# Patient Record
Sex: Male | Born: 1960 | Race: Black or African American | Hispanic: No | Marital: Married | Smoking: Former smoker
Health system: Southern US, Community
[De-identification: ages and names within clinical notes are randomized; demographics above are authoritative.]

## PROBLEM LIST (undated history)

## (undated) DIAGNOSIS — E119 Type 2 diabetes mellitus without complications: Secondary | ICD-10-CM

## (undated) DIAGNOSIS — I10 Essential (primary) hypertension: Secondary | ICD-10-CM

## (undated) DIAGNOSIS — E785 Hyperlipidemia, unspecified: Secondary | ICD-10-CM

## (undated) DIAGNOSIS — I639 Cerebral infarction, unspecified: Secondary | ICD-10-CM

## (undated) HISTORY — PX: CHOLECYSTECTOMY: SHX55

---

## 2010-07-21 ENCOUNTER — Emergency Department: Payer: Self-pay | Admitting: Emergency Medicine

## 2010-07-29 ENCOUNTER — Ambulatory Visit: Payer: Self-pay | Admitting: Family Medicine

## 2014-06-11 ENCOUNTER — Emergency Department: Payer: Self-pay | Admitting: Emergency Medicine

## 2014-10-08 ENCOUNTER — Emergency Department: Payer: TRICARE For Life (TFL)

## 2014-10-08 ENCOUNTER — Other Ambulatory Visit: Payer: Self-pay

## 2014-10-08 ENCOUNTER — Encounter: Payer: Self-pay | Admitting: Emergency Medicine

## 2014-10-08 ENCOUNTER — Emergency Department
Admission: EM | Admit: 2014-10-08 | Discharge: 2014-10-08 | Disposition: A | Discharge status: Home / Self Care | Payer: TRICARE For Life (TFL) | Attending: Emergency Medicine | Admitting: Emergency Medicine

## 2014-10-08 DIAGNOSIS — E119 Type 2 diabetes mellitus without complications: Secondary | ICD-10-CM | POA: Insufficient documentation

## 2014-10-08 DIAGNOSIS — R202 Paresthesia of skin: Secondary | ICD-10-CM | POA: Insufficient documentation

## 2014-10-08 DIAGNOSIS — R42 Dizziness and giddiness: Secondary | ICD-10-CM | POA: Diagnosis present

## 2014-10-08 DIAGNOSIS — R51 Headache: Secondary | ICD-10-CM | POA: Insufficient documentation

## 2014-10-08 DIAGNOSIS — Z87891 Personal history of nicotine dependence: Secondary | ICD-10-CM | POA: Insufficient documentation

## 2014-10-08 DIAGNOSIS — I10 Essential (primary) hypertension: Secondary | ICD-10-CM | POA: Insufficient documentation

## 2014-10-08 DIAGNOSIS — R200 Anesthesia of skin: Secondary | ICD-10-CM | POA: Insufficient documentation

## 2014-10-08 HISTORY — DX: Type 2 diabetes mellitus without complications: E11.9

## 2014-10-08 HISTORY — DX: Essential (primary) hypertension: I10

## 2014-10-08 LAB — COMPREHENSIVE METABOLIC PANEL
ALK PHOS: 66 U/L (ref 38–126)
ALT: 26 U/L (ref 17–63)
AST: 22 U/L (ref 15–41)
Albumin: 4.9 g/dL (ref 3.5–5.0)
Anion gap: 10 (ref 5–15)
BILIRUBIN TOTAL: 0.5 mg/dL (ref 0.3–1.2)
BUN: 20 mg/dL (ref 6–20)
CO2: 30 mmol/L (ref 22–32)
Calcium: 9.9 mg/dL (ref 8.9–10.3)
Chloride: 95 mmol/L — ABNORMAL LOW (ref 101–111)
Creatinine, Ser: 1.07 mg/dL (ref 0.61–1.24)
GFR calc non Af Amer: >60 mL/min (ref >60)
Glucose, Bld: 182 mg/dL — ABNORMAL HIGH (ref 65–99)
Potassium: 4 mmol/L (ref 3.5–5.1)
Sodium: 135 mmol/L (ref 135–145)
Total Protein: 8.7 g/dL — ABNORMAL HIGH (ref 6.5–8.1)

## 2014-10-08 LAB — CBC
HCT: 47.1 % (ref 40.0–52.0)
Hemoglobin: 15.8 g/dL (ref 13.0–18.0)
MCH: 30 pg (ref 26.0–34.0)
MCHC: 33.6 g/dL (ref 32.0–36.0)
MCV: 89.4 fL (ref 80.0–100.0)
Platelets: 213 K/uL (ref 150–440)
RBC: 5.27 MIL/uL (ref 4.40–5.90)
RDW: 14.2 % (ref 11.5–14.5)
WBC: 4.4 K/uL (ref 3.8–10.6)

## 2014-10-08 LAB — TROPONIN I

## 2014-10-08 MED ORDER — MAGNESIUM SULFATE 2 GM/50ML IV SOLN
2.0000 g | Freq: Once | INTRAVENOUS | Status: AC
  Administered 2014-10-08: 2 g via INTRAVENOUS

## 2014-10-08 MED ORDER — ONDANSETRON 8 MG PO TBDP
8.0000 mg | ORAL_TABLET | ORAL | Status: AC
  Administered 2014-10-08: 8 mg via ORAL

## 2014-10-08 MED ORDER — MECLIZINE HCL 25 MG PO TABS
25.0000 mg | ORAL_TABLET | Freq: Once | ORAL | Status: AC
  Administered 2014-10-08: 25 mg via ORAL

## 2014-10-08 MED ORDER — MECLIZINE HCL 25 MG PO TABS
50.0000 mg | ORAL_TABLET | Freq: Once | ORAL | Status: AC
  Administered 2014-10-08: 50 mg via ORAL

## 2014-10-08 MED ORDER — PREDNISONE 20 MG PO TABS
ORAL_TABLET | ORAL | Status: AC
  Administered 2014-10-08: 60 mg via ORAL
  Filled 2014-10-08: qty 3

## 2014-10-08 MED ORDER — PREDNISONE 20 MG PO TABS
60.0000 mg | ORAL_TABLET | Freq: Once | ORAL | Status: AC
  Administered 2014-10-08: 60 mg via ORAL

## 2014-10-08 MED ORDER — MECLIZINE HCL 25 MG PO TABS
ORAL_TABLET | ORAL | Status: AC
  Administered 2014-10-08: 50 mg via ORAL
  Filled 2014-10-08: qty 2

## 2014-10-08 MED ORDER — PROCHLORPERAZINE EDISYLATE 5 MG/ML IJ SOLN
INTRAMUSCULAR | Status: AC
  Administered 2014-10-08: 10 mg via INTRAMUSCULAR
  Filled 2014-10-08: qty 2

## 2014-10-08 MED ORDER — KETOROLAC TROMETHAMINE 30 MG/ML IJ SOLN
INTRAMUSCULAR | Status: AC
  Filled 2014-10-08: qty 1

## 2014-10-08 MED ORDER — MECLIZINE HCL 25 MG PO TABS: 25.0000 mg | ORAL_TABLET | Freq: Three times a day (TID) | ORAL | Status: DC | PRN

## 2014-10-08 MED ORDER — SODIUM CHLORIDE 0.9 % IV BOLUS (SEPSIS)
1000.0000 mL | Freq: Once | INTRAVENOUS | Status: AC
  Administered 2014-10-08: 1000 mL via INTRAVENOUS

## 2014-10-08 MED ORDER — ONDANSETRON 8 MG PO TBDP
ORAL_TABLET | ORAL | Status: AC
  Administered 2014-10-08: 8 mg via ORAL
  Filled 2014-10-08: qty 1

## 2014-10-08 MED ORDER — KETOROLAC TROMETHAMINE 30 MG/ML IJ SOLN
30.0000 mg | Freq: Once | INTRAMUSCULAR | Status: AC
  Administered 2014-10-08: 30 mg via INTRAVENOUS

## 2014-10-08 MED ORDER — PROCHLORPERAZINE EDISYLATE 5 MG/ML IJ SOLN
10.0000 mg | Freq: Four times a day (QID) | INTRAMUSCULAR | Status: DC | PRN
  Administered 2014-10-08: 10 mg via INTRAMUSCULAR

## 2014-10-08 MED ORDER — ONDANSETRON 8 MG PO TBDP
8.0000 mg | ORAL_TABLET | Freq: Once | ORAL | Status: AC
  Administered 2014-10-08: 8 mg via ORAL

## 2014-10-08 MED ORDER — MAGNESIUM SULFATE 2 GM/50ML IV SOLN
INTRAVENOUS | Status: AC
  Filled 2014-10-08: qty 50

## 2014-10-08 NOTE — Discharge Instructions (Signed)
Benign Positional Vertigo Vertigo means you feel like you or your surroundings are moving when they are not. Benign positional vertigo is the most common form of vertigo. Benign means that the cause of your condition is not serious. Benign positional vertigo is more common in older adults. CAUSES  Benign positional vertigo is the result of an upset in the labyrinth system. This is an area in the middle ear that helps control your balance. This may be caused by a viral infection, head injury, or repetitive motion. However, often no specific cause is found. SYMPTOMS  Symptoms of benign positional vertigo occur when you move your head or eyes in different directions. Some of the symptoms may include:  Loss of balance and falls.  Vomiting.  Blurred vision.  Dizziness.  Nausea.  Involuntary eye movements (nystagmus). DIAGNOSIS  Benign positional vertigo is usually diagnosed by physical exam. If the specific cause of your benign positional vertigo is unknown, your caregiver may perform imaging tests, such as magnetic resonance imaging (MRI) or computed tomography (CT). TREATMENT  Your caregiver may recommend movements or procedures to correct the benign positional vertigo. Medicines such as meclizine, benzodiazepines, and medicines for nausea may be used to treat your symptoms. In rare cases, if your symptoms are caused by certain conditions that affect the inner ear, you may need surgery. HOME CARE INSTRUCTIONS   Follow your caregiver's instructions.  Move slowly. Do not make sudden body or head movements.  Avoid driving.  Avoid operating heavy machinery.  Avoid performing any tasks that would be dangerous to you or others during a vertigo episode.  Drink enough fluids to keep your urine clear or pale yellow. SEEK IMMEDIATE MEDICAL CARE IF:   You develop problems with walking, weakness, numbness, or using your arms, hands, or legs.  You have difficulty speaking.  You develop  severe headaches.  Your nausea or vomiting continues or gets worse.  You develop visual changes.  Your family or friends notice any behavioral changes.  Your condition gets worse.  You have a fever.  You develop a stiff neck or sensitivity to light. MAKE SURE YOU:   Understand these instructions.  Will watch your condition.  Will get help right away if you are not doing well or get worse. Document Released: 02/09/2006 Document Revised: 07/27/2011 Document Reviewed: 01/22/2011 Exit[employer] Patient Information 2015 [employer]. This information is not intended to replace advice given to you by your health care provider. Make sure you discuss any questions you have with your health care provider.    

## 2014-10-08 NOTE — ED Notes (Signed)
Went to discharge pt, pt very sedated from medications given prior. MD notified and came to bedside to evaluate pt. Decided to let pt sleep longer and reassess. Family agreeable to plan.

## 2014-10-08 NOTE — ED Provider Notes (Addendum)
[facility] Emergency Department Provider Note  ____________________________________________  Time seen: 12:10 PM  I have reviewed the triage vital signs and the nursing notes.   HISTORY  Chief Complaint Dizziness    HPI George Armstrong is a 54 y.o. male who complains of intermittent dizziness for the past 4 days. It appears to be worse when he tries to strain his eyes to look at things without using his glasses. Heis been using the wrong glasses as he requires bifocals. Sometimes he does not use any glasses at all and when he does this and really strains to look at his phone screen, he has dizziness. At the same time. He also has severe dizziness with motion. 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 It is noted that this is aggravated his dizziness over the past 4 days. Numbness, tingling or weakness. No chest pain or shortness of breath. He has never had anything like this before. No recent illnesses. No change in hearing or vision.  Since arrival in the ED he also reports a visual disturbance consistent with scintillating scotoma, which he reports is typical for his migraine headaches coming on     Past Medical History  Diagnosis Date  . Diabetes mellitus without complication   . Hypertension     There are no active problems to display for this patient.   No past surgical history on file.  Current Outpatient Rx  Name  Route  Sig  Dispense  Refill  . meclizine (ANTIVERT) 25 MG tablet   Oral   Take 1 tablet (25 mg total) by mouth 3 (three) times daily as needed for dizziness or nausea.   30 tablet   1     Allergies Review of patient's allergies indicates no known allergies.  No family history on file.  Social History History  Substance Use Topics  . Smoking status: Former Games developer  . Smokeless tobacco: Not on file  . Alcohol Use: No    Review of Systems  Constitutional: No fever or chills. No weight  changes Eyes:No blurry vision or double vision.  ENT: No sore throat. Cardiovascular: No chest pain. Respiratory: No dyspnea or cough. Gastrointestinal: Negative for abdominal pain, vomiting and diarrhea.  No BRBPR or melena. Genitourinary: Negative for dysuria, urinary retention, bloody urine, or difficulty urinating. Musculoskeletal: Negative for back pain. No joint swelling or pain. Skin: Negative for rash. Neurological: Mild headache diffusely Psychiatric:No anxiety or depression.   Endocrine:No hot/cold intolerance, changes in energy, or sleep difficulty.  10-point ROS otherwise negative.  ____________________________________________   PHYSICAL EXAM:  VITAL SIGNS: ED Triage Vitals  Enc Vitals Group     BP 10/08/14 1013 137/86 mmHg     Pulse Rate 10/08/14 1013 93     Resp 10/08/14 1013 20     Temp 10/08/14 1013 98 F (36.7 C)     Temp Source 10/08/14 1013 Oral     SpO2 10/08/14 1013 100 %     Weight 10/08/14 1013 203 lb (92.08 kg)     Height 10/08/14 1013  (1.702 m)     Head Cir --      Peak Flow --      Pain Score 10/08/14 1014 0     Pain Loc --      Pain Edu? --      Excl. in GC? --      Constitutional: Alert and oriented. Well appearing and in no distress. Eyes:  No scleral icterus. No conjunctival pallor. PERRL. EOMI ENT   Head: Normocephalic and atraumatic.   Nose: No congestion/rhinnorhea. No septal hematoma   Mouth/Throat: MMM, no pharyngeal erythema. No peritonsillar mass. No uvula shift.   Neck: No stridor. No SubQ emphysema. No meningismus. Hematological/Lymphatic/Immunilogical: No cervical lymphadenopathy. Cardiovascular: RRR. Normal and symmetric distal pulses are present in all extremities. No murmurs, rubs, or gallops. Respiratory: Normal respiratory effort without tachypnea nor retractions. Breath sounds are clear and equal bilaterally. No wheezes/rales/rhonchi. Gastrointestinal: Soft and nontender. No distention. There is no CVA  tenderness.  No rebound, rigidity, or guarding. Genitourinary: deferred Musculoskeletal: Nontender with normal range of motion in all extremities. No joint effusions.  No lower extremity tenderness.  No edema. Neurologic:   Normal speech and language.  CN 2-10 normal. Brief leftward nystagmus that terminates within 10 beats. Dix-Hallpike maneuver strongly positive on the left with immediate severe nausea and vomiting. Motor grossly intact. No pronator drift.  Normal gait. No gross focal neurologic deficits are appreciated.  Skin:  Skin is warm, dry and intact. No rash noted.  No petechiae, purpura, or bullae. Psychiatric: Mood and affect are normal. Speech and behavior are normal. Patient exhibits appropriate insight and judgment.  ____________________________________________    LABS (pertinent positives/negatives) (all labs ordered are listed, but only abnormal results are displayed) Labs Reviewed  COMPREHENSIVE METABOLIC PANEL - Abnormal; Notable for the following:    Chloride 95 (*)    Glucose, Bld 182 (*)    Total Protein 8.7 (*)    All other components within normal limits  CBC  TROPONIN I   ____________________________________________   EKG  normal sinus rhythm, rate of 73, normal axis intervals, QRS ST and T segments. This EKG was interpreted by me  ____________________________________________    RADIOLOGY   CT head unremarkable  ____________________________________________   PROCEDURES  ____________________________________________   INITIAL IMPRESSION / ASSESSMENT AND PLAN / ED COURSE  Pertinent labs & imaging results that were available during my care of the patient were reviewed by me and considered in my medical decision making (see chart for details).  The patient presents with benign positional vertigo with vomiting. He also likely is having some onset of migraine headaches. No evidence of stroke ICH, meningitis, sepsis or hypotension. We'll give the  patient antiemetics and antihistamines and reassessed.  ----------------------------------------- 1:33 PM on 10/08/2014 -----------------------------------------  Patient still having some nausea and vomiting with movement despite Zofran and Compazine. We'll proceed with IV hydration and medication.  ----------------------------------------- 4:10 PM on 10/08/2014 -----------------------------------------  After medication administration, the patient is feeling better. Around 3 PM he did have an episode of vomiting after rolling over in bed, so we put an IV and gave him IV fluids. He is feeling better now. Feeling "crazy", which is likely a adverse reaction to the Compazine. He otherwise feels better and is able to move without severe symptoms. We will discharge him home with a perception for meclizine and have him follow up with primary care clinic. Low suspicion of hemorrhage, meningitis, encephalitis, glaucoma or pseudotumor, carotid pathology, ACS or TAD.   ____________________________________________   FINAL CLINICAL IMPRESSION(S) / ED DIAGNOSES  Final diagnoses:  Vertigo   benign positional vertigo    Sharman Cheek, MD 10/08/14 1612  ----------------------------------------- 4:34 PM on 10/08/2014 -----------------------------------------  On trying to discharge the patient, he is very drowsy. He is able to pull himself up to sitting and hold himself upright. He is not having any vomiting with movement. Cranial nerves are intact.  Pupils are about 2 mm symmetric bilaterally. He does follow commands. No focal weakness. It appears he is having a medication reaction that is caused intoxication and drowsiness. We'll continue monitor him as I expect that his symptoms will resolve shortly.  ----------------------------------------- 6:31 PM on 10/08/2014 -----------------------------------------  Now more alert. Able to sit up and is steady. Has family who state with him and can  ensure that he returns to baseline and is not to drive or operate machinery or work on ladders until he feels back to normal.  Sharman CheekPhillip Loney Domingo, MD 10/08/14 [phone]

## 2014-10-08 NOTE — ED Notes (Signed)
States dizzy episodes and visual disturbances with headache intermittent x 4 days, no unilateral weaknesses noted

## 2014-10-08 NOTE — ED Notes (Signed)
Wife went to get prescriptions filled, pt to be discharged upon return. Son remains with pt at bedside.

## 2014-10-08 NOTE — ED Notes (Signed)
Pt reassessed, able to sit up in bed and stay awake. Pt did vomit x 1 after sitting up for a few minutes. MD made aware and received new orders for medication. Will give and allow pt to be discharged.

## 2014-10-10 ENCOUNTER — Emergency Department: Payer: TRICARE For Life (TFL)

## 2014-10-10 ENCOUNTER — Other Ambulatory Visit: Payer: Self-pay

## 2014-10-10 ENCOUNTER — Encounter: Payer: Self-pay | Admitting: Emergency Medicine

## 2014-10-10 ENCOUNTER — Emergency Department
Admission: EM | Admit: 2014-10-10 | Discharge: 2014-10-11 | Disposition: A | Discharge status: Other Acute Inpatient Hospital | Payer: TRICARE For Life (TFL) | Attending: Emergency Medicine | Admitting: Emergency Medicine

## 2014-10-10 DIAGNOSIS — I6350 Cerebral infarction due to unspecified occlusion or stenosis of unspecified cerebral artery: Secondary | ICD-10-CM

## 2014-10-10 DIAGNOSIS — I10 Essential (primary) hypertension: Secondary | ICD-10-CM | POA: Diagnosis not present

## 2014-10-10 DIAGNOSIS — R42 Dizziness and giddiness: Secondary | ICD-10-CM | POA: Insufficient documentation

## 2014-10-10 DIAGNOSIS — R4182 Altered mental status, unspecified: Secondary | ICD-10-CM | POA: Diagnosis present

## 2014-10-10 DIAGNOSIS — I639 Cerebral infarction, unspecified: Secondary | ICD-10-CM | POA: Insufficient documentation

## 2014-10-10 DIAGNOSIS — E119 Type 2 diabetes mellitus without complications: Secondary | ICD-10-CM | POA: Insufficient documentation

## 2014-10-10 DIAGNOSIS — Z79899 Other long term (current) drug therapy: Secondary | ICD-10-CM | POA: Insufficient documentation

## 2014-10-10 DIAGNOSIS — Z87891 Personal history of nicotine dependence: Secondary | ICD-10-CM | POA: Diagnosis not present

## 2014-10-10 LAB — COMPREHENSIVE METABOLIC PANEL
ALBUMIN: 4.4 g/dL (ref 3.5–5.0)
ALT: 27 U/L (ref 17–63)
AST: 26 U/L (ref 15–41)
Alkaline Phosphatase: 66 U/L (ref 38–126)
Anion gap: 13 (ref 5–15)
BUN: 17 mg/dL (ref 6–20)
CHLORIDE: 96 mmol/L — AB (ref 101–111)
CO2: 28 mmol/L (ref 22–32)
Calcium: 9.4 mg/dL (ref 8.9–10.3)
Creatinine, Ser: 1.18 mg/dL (ref 0.61–1.24)
GFR calc non Af Amer: >60 mL/min (ref >60)
Glucose, Bld: 251 mg/dL — ABNORMAL HIGH (ref 65–99)
Potassium: 3.8 mmol/L (ref 3.5–5.1)
Sodium: 137 mmol/L (ref 135–145)
Total Bilirubin: 0.5 mg/dL (ref 0.3–1.2)
Total Protein: 8.3 g/dL — ABNORMAL HIGH (ref 6.5–8.1)

## 2014-10-10 LAB — CBC
HCT: 48.1 % (ref 40.0–52.0)
HEMOGLOBIN: 15.9 g/dL (ref 13.0–18.0)
MCH: 29.6 pg (ref 26.0–34.0)
MCHC: 33 g/dL (ref 32.0–36.0)
MCV: 89.8 fL (ref 80.0–100.0)
Platelets: 222 10*3/uL (ref 150–440)
RBC: 5.36 MIL/uL (ref 4.40–5.90)
RDW: 14.4 % (ref 11.5–14.5)
WBC: 8 10*3/uL (ref 3.8–10.6)

## 2014-10-10 LAB — URINE DRUG SCREEN, QUALITATIVE (ARMC ONLY)
Amphetamines, Ur Screen: NOT DETECTED
BARBITURATES, UR SCREEN: NOT DETECTED
BENZODIAZEPINE, UR SCRN: NOT DETECTED
Cannabinoid 50 Ng, Ur ~~LOC~~: NOT DETECTED
Cocaine Metabolite,Ur ~~LOC~~: NOT DETECTED
MDMA (ECSTASY) UR SCREEN: NOT DETECTED
METHADONE SCREEN, URINE: NOT DETECTED
Opiate, Ur Screen: POSITIVE — AB
PHENCYCLIDINE (PCP) UR S: NOT DETECTED
Tricyclic, Ur Screen: NOT DETECTED

## 2014-10-10 LAB — URINALYSIS COMPLETE WITH MICROSCOPIC (ARMC ONLY)
BILIRUBIN URINE: NEGATIVE
Bacteria, UA: NONE SEEN
Glucose, UA: 50 mg/dL — AB
Hgb urine dipstick: NEGATIVE
LEUKOCYTES UA: NEGATIVE
NITRITE: NEGATIVE
Protein, ur: 30 mg/dL — AB
RBC / HPF: NONE SEEN RBC/hpf (ref 0–5)
Specific Gravity, Urine: 1.019 (ref 1.005–1.030)
Squamous Epithelial / LPF: NONE SEEN
WBC, UA: NONE SEEN WBC/hpf (ref 0–5)
pH: 5 (ref 5.0–8.0)

## 2014-10-10 LAB — APTT: aPTT: 24 seconds (ref 24–36)

## 2014-10-10 LAB — PROTIME-INR
INR: 1.02
Prothrombin Time: 13.6 seconds (ref 11.4–15.0)

## 2014-10-10 MED ORDER — HYDROCHLOROTHIAZIDE 25 MG PO TABS
ORAL_TABLET | ORAL | Status: AC
  Administered 2014-10-10: 25 mg via ORAL
  Filled 2014-10-10: qty 1

## 2014-10-10 MED ORDER — DEXAMETHASONE SODIUM PHOSPHATE 10 MG/ML IJ SOLN
10.0000 mg | Freq: Once | INTRAMUSCULAR | Status: AC
  Administered 2014-10-10: 10 mg via INTRAVENOUS

## 2014-10-10 MED ORDER — PANTOPRAZOLE SODIUM 40 MG PO TBEC
DELAYED_RELEASE_TABLET | ORAL | Status: AC
  Filled 2014-10-10: qty 1

## 2014-10-10 MED ORDER — DEXAMETHASONE SODIUM PHOSPHATE 10 MG/ML IJ SOLN
INTRAMUSCULAR | Status: AC
Start: 2014-10-10 — End: 2014-10-10
  Administered 2014-10-10: 10 mg via INTRAVENOUS
  Filled 2014-10-10: qty 1

## 2014-10-10 MED ORDER — LISINOPRIL 20 MG PO TABS
10.0000 mg | ORAL_TABLET | Freq: Once | ORAL | Status: AC
  Administered 2014-10-10: 10 mg via ORAL

## 2014-10-10 MED ORDER — LISINOPRIL 20 MG PO TABS
ORAL_TABLET | ORAL | Status: AC
  Administered 2014-10-10: 10 mg via ORAL
  Filled 2014-10-10: qty 1

## 2014-10-10 MED ORDER — GADOBENATE DIMEGLUMINE 529 MG/ML IV SOLN
20.0000 mL | Freq: Once | INTRAVENOUS | Status: AC | PRN
  Administered 2014-10-10: 19 mL via INTRAVENOUS

## 2014-10-10 MED ORDER — HYDROCHLOROTHIAZIDE 25 MG PO TABS
25.0000 mg | ORAL_TABLET | Freq: Every day | ORAL | Status: DC
  Administered 2014-10-10: 25 mg via ORAL

## 2014-10-10 NOTE — ED Notes (Signed)
Wife reports pt seen here last week dx with vertigo and dishcarged home. Since then pt has only gotten worse. Trouble speaking,  Walking and focusing. Pt denies any pain at this time.

## 2014-10-10 NOTE — ED Notes (Signed)
Pt to mri with Erskine SquibbJane, RN

## 2014-10-10 NOTE — ED Provider Notes (Signed)
[facility] Emergency Department Provider Note  ____________________________________________  Time seen: 4:40 PM  I have reviewed the triage vital signs and the nursing notes.   HISTORY  Chief Complaint Altered Mental Status   History of present illness Limited by altered mental status, history mainly given by wife   HPI George Armstrong is a 54 y.o. male who presents with altered mental status. Patient was recently seen in the emergency department and treated for vertigo. Apparently at home he has gotten much worse to the point where his mental status is altered and he is unable to speak normally. He is able to move all his extremities and is able to answer questions but has some difficulty. Wife denies fevers. No vomiting. He was sent over by his PCP today for altered mental status.      Past Medical History  Diagnosis Date  . Diabetes mellitus without complication   . Hypertension     There are no active problems to display for this patient.   Past Surgical History  Procedure Laterality Date  . Cholecystectomy      Current Outpatient Rx  Name  Route  Sig  Dispense  Refill  . hydrochlorothiazide (HYDRODIURIL) 25 MG tablet   Oral   Take 25 mg by mouth daily.         . Hydrocodone-Acetaminophen 7.5-300 MG TABS   Oral   Take 1 tablet by mouth every 4 (four) hours as needed (for pain).         Marland Kitchen. lisinopril (PRINIVIL,ZESTRIL) 10 MG tablet   Oral   Take 10 mg by mouth daily.         . meclizine (ANTIVERT) 25 MG tablet   Oral   Take 1 tablet (25 mg total) by mouth 3 (three) times daily as needed for dizziness or nausea.   30 tablet   1   . metFORMIN (GLUCOPHAGE-XR) 500 MG 24 hr tablet   Oral   Take 500 mg by mouth daily.            Allergies Review of patient's allergies indicates no known allergies.  No family history on file.  Social History History  Substance Use Topics  . Smoking status: Former Games developermoker  . Smokeless  tobacco: Never Used  . Alcohol Use: Yes     Comment: 2 x / month    Review of Systems  Constitutional: Negative for fever. Eyes: Negative for visual changes. ENT: Negative for sore throat Cardiovascular: Negative for chest pain. Respiratory: Negative for shortness of breath. Gastrointestinal: Negative for abdominal pain, vomiting and diarrhea. Genitourinary: Negative for dysuria. Musculoskeletal: Negative for back pain. Skin: Negative for rash. Neurological: Positive for dizziness   10-point ROS otherwise negative.  ____________________________________________   PHYSICAL EXAM:  VITAL SIGNS: ED Triage Vitals  Enc Vitals Group     BP 10/10/14 1623 163/81 mmHg     Pulse Rate 10/10/14 1623 60     Resp 10/10/14 1623 16     Temp 10/10/14 1623 98 F (36.7 C)     Temp Source 10/10/14 1623 Oral     SpO2 10/10/14 1623 99 %     Weight 10/10/14 1623 205 lb (92.987 kg)     Height 10/10/14 1623 5\' 7"  (1.702 m)     Head Cir --      Peak Flow --      Pain Score 10/10/14 1625 0     Pain Loc --      Pain Edu? --  Excl. in GC? --      Constitutional: Alert but withdrawn Eyes: Conjunctivae are normal. PERRL. ENT   Head: Normocephalic and atraumatic.   Nose: No rhinnorhea.   Mouth/Throat: Mucous membranes are moist. Cardiovascular: Normal rate, regular rhythm. Normal and symmetric distal pulses are present in all extremities. No murmurs, rubs, or gallops. Respiratory: Normal respiratory effort without tachypnea nor retractions. Breath sounds are clear and equal bilaterally.  Gastrointestinal: Soft and non-tender in all quadrants. No distention. There is no CVA tenderness. Genitourinary: deferred Musculoskeletal: Nontender with normal range of motion in all extremities. No lower extremity tenderness nor edema. Neurologic:  Cranial nerves II through XII are intact. Patient moves all extremities well and has equal strength. Speech is abnormal in that he is slow to  respond and per wife speaking very abnormally. He does not seem to have any meningismus Skin:  Skin is warm, dry and intact. No rash noted. Psychiatric: Patient is calm considering his mental state  ____________________________________________    LABS (pertinent positives/negatives)  Labs Reviewed  COMPREHENSIVE METABOLIC PANEL - Abnormal; Notable for the following:    Chloride 96 (*)    Glucose, Bld 251 (*)    Total Protein 8.3 (*)    All other components within normal limits  URINALYSIS COMPLETEWITH MICROSCOPIC ([facility] ONLY) - Abnormal; Notable for the following:    Color, Urine YELLOW (*)    APPearance CLEAR (*)    Glucose, UA 50 (*)    Ketones, ur 1+ (*)    Protein, ur 30 (*)    All other components within normal limits  URINE DRUG SCREEN, QUALITATIVE ([facility] ONLY) - Abnormal; Notable for the following:    Opiate, Ur Screen POSITIVE (*)    All other components within normal limits  CULTURE, BLOOD (ROUTINE X 2)  CULTURE, BLOOD (ROUTINE X 2)  CBC  APTT  PROTIME-INR    ____________________________________________   EKG  ED ECG REPORT I, Jene Every, the attending physician, personally viewed and interpreted this ECG.   Date: 10/10/2014  EKG Time: 4:26 PM  Rate: 62  Rhythm: normal sinus rhythm, nonspecific ST and T waves changes  Axis: Normal  Intervals:none  ST&T Change: Nonspecific changes   ____________________________________________    RADIOLOGY  CT had markedly abnormal, see report  ____________________________________________   PROCEDURES  Procedure(s) performed: none  Critical Care performed:  CRITICAL CARE Performed by: Jene Every   Total critical care time:55  Critical care time was exclusive of separately billable procedures and treating other patients.  Critical care was necessary to treat or prevent imminent or life-threatening deterioration.  Critical care was time spent personally by me on the following activities:  development of treatment plan with patient and/or surrogate as well as nursing, discussions with consultants, evaluation of patient's response to treatment, examination of patient, obtaining history from patient or surrogate, ordering and performing treatments and interventions, ordering and review of laboratory studies, ordering and review of radiographic studies, pulse oximetry and re-evaluation of patient's condition.   ____________________________________________   INITIAL IMPRESSION / ASSESSMENT AND PLAN / ED COURSE  Pertinent labs & imaging results that were available during my care of the patient were reviewed by me and considered in my medical decision making (see chart for details).  Patient with concerning presentation. We will send for repeat CT scan to evaluate for CVA or aneurysmal bleeding. No fever no meningismus, at this time do not suspect meningitis  ____________________________________________ ----------------------------------------- 5:16 PM on 10/10/2014 -----------------------------------------  Notified by radiologist at approximate  5:00 PM with concerning changes on CT scan, MRI ordered at radiology request. Discussed with MRI tech. Discussed with family  ----------------------------------------- 7:54 PM on 10/10/2014 -----------------------------------------  Contacted by radiologist regarding MRI results. We'll arrange for transfer to [facility]  Accepted by Dr. Thad Ranger  ----------------------------------------- 8:15 PM on 10/10/2014 -----------------------------------------  Patient resting quietly, blood pressure 140/60, heart rate 72. Discussed MRI results extensively with family and reasons for transfer  FINAL CLINICAL IMPRESSION(S) / ED DIAGNOSES  Final diagnoses:  Altered mental status  Posterior circulation stroke     Jene Every, MD 10/10/14 2016

## 2014-10-10 NOTE — ED Notes (Signed)
[facility]; was seen here on Monday for vertigo and has gotten progressively worse. His altered mental status has progressed since this morning and wife states that he alternates between being very difficult to understand (sluring) to speaking clearly. Pt has had headache since Monday. Pt is very sleepy and has a difficult time keeping his head up and eyes open. Pupils very small, with brisk reaction to light.

## 2014-10-11 ENCOUNTER — Inpatient Hospital Stay (HOSPITAL_COMMUNITY)

## 2014-10-11 ENCOUNTER — Inpatient Hospital Stay (HOSPITAL_COMMUNITY)
Admission: AD | Admit: 2014-10-11 | Discharge: 2014-10-17 | DRG: 064 | Disposition: A | Discharge status: Home / Self Care | Source: Other Acute Inpatient Hospital | Attending: Neurology | Admitting: Neurology

## 2014-10-11 ENCOUNTER — Encounter (HOSPITAL_COMMUNITY): Payer: Self-pay | Admitting: *Deleted

## 2014-10-11 DIAGNOSIS — I639 Cerebral infarction, unspecified: Secondary | ICD-10-CM

## 2014-10-11 DIAGNOSIS — E669 Obesity, unspecified: Secondary | ICD-10-CM | POA: Diagnosis present

## 2014-10-11 DIAGNOSIS — E785 Hyperlipidemia, unspecified: Secondary | ICD-10-CM | POA: Diagnosis present

## 2014-10-11 DIAGNOSIS — E870 Hyperosmolality and hypernatremia: Secondary | ICD-10-CM | POA: Diagnosis not present

## 2014-10-11 DIAGNOSIS — R059 Cough, unspecified: Secondary | ICD-10-CM

## 2014-10-11 DIAGNOSIS — R51 Headache: Secondary | ICD-10-CM | POA: Diagnosis present

## 2014-10-11 DIAGNOSIS — E871 Hypo-osmolality and hyponatremia: Secondary | ICD-10-CM | POA: Diagnosis present

## 2014-10-11 DIAGNOSIS — Z6832 Body mass index (BMI) 32.0-32.9, adult: Secondary | ICD-10-CM | POA: Diagnosis not present

## 2014-10-11 DIAGNOSIS — G936 Cerebral edema: Secondary | ICD-10-CM | POA: Diagnosis present

## 2014-10-11 DIAGNOSIS — E1159 Type 2 diabetes mellitus with other circulatory complications: Secondary | ICD-10-CM

## 2014-10-11 DIAGNOSIS — I6340 Cerebral infarction due to embolism of unspecified cerebral artery: Principal | ICD-10-CM | POA: Diagnosis present

## 2014-10-11 DIAGNOSIS — I6789 Other cerebrovascular disease: Secondary | ICD-10-CM | POA: Diagnosis not present

## 2014-10-11 DIAGNOSIS — Z87891 Personal history of nicotine dependence: Secondary | ICD-10-CM | POA: Diagnosis not present

## 2014-10-11 DIAGNOSIS — I6312 Cerebral infarction due to embolism of basilar artery: Secondary | ICD-10-CM | POA: Diagnosis not present

## 2014-10-11 DIAGNOSIS — R05 Cough: Secondary | ICD-10-CM

## 2014-10-11 DIAGNOSIS — E119 Type 2 diabetes mellitus without complications: Secondary | ICD-10-CM | POA: Diagnosis present

## 2014-10-11 DIAGNOSIS — I63119 Cerebral infarction due to embolism of unspecified vertebral artery: Secondary | ICD-10-CM | POA: Diagnosis not present

## 2014-10-11 DIAGNOSIS — G911 Obstructive hydrocephalus: Secondary | ICD-10-CM | POA: Diagnosis not present

## 2014-10-11 DIAGNOSIS — I10 Essential (primary) hypertension: Secondary | ICD-10-CM

## 2014-10-11 DIAGNOSIS — Z452 Encounter for adjustment and management of vascular access device: Secondary | ICD-10-CM

## 2014-10-11 LAB — CBC
HEMATOCRIT: 46.9 % (ref 39.0–52.0)
HEMOGLOBIN: 15.9 g/dL (ref 13.0–17.0)
MCH: 29.9 pg (ref 26.0–34.0)
MCHC: 33.9 g/dL (ref 30.0–36.0)
MCV: 88.2 fL (ref 78.0–100.0)
Platelets: 244 10*3/uL (ref 150–400)
RBC: 5.32 MIL/uL (ref 4.22–5.81)
RDW: 13.7 % (ref 11.5–15.5)
WBC: 8.4 10*3/uL (ref 4.0–10.5)

## 2014-10-11 LAB — BASIC METABOLIC PANEL
Anion gap: 15 (ref 5–15)
BUN: 14 mg/dL (ref 6–20)
CALCIUM: 9.7 mg/dL (ref 8.9–10.3)
CO2: 24 mmol/L (ref 22–32)
Chloride: 93 mmol/L — ABNORMAL LOW (ref 101–111)
Creatinine, Ser: 1.01 mg/dL (ref 0.61–1.24)
Glucose, Bld: 262 mg/dL — ABNORMAL HIGH (ref 65–99)
POTASSIUM: 4.2 mmol/L (ref 3.5–5.1)
Sodium: 132 mmol/L — ABNORMAL LOW (ref 135–145)

## 2014-10-11 LAB — LIPID PANEL
Cholesterol: 359 mg/dL — ABNORMAL HIGH (ref 0–200)
HDL: 55 mg/dL (ref >40)
LDL Cholesterol: 281 mg/dL — ABNORMAL HIGH (ref 0–99)
TRIGLYCERIDES: 115 mg/dL (ref <150)
Total CHOL/HDL Ratio: 6.5 RATIO
VLDL: 23 mg/dL (ref 0–40)

## 2014-10-11 LAB — SODIUM
SODIUM: 135 mmol/L (ref 135–145)
Sodium: 134 mmol/L — ABNORMAL LOW (ref 135–145)

## 2014-10-11 LAB — MRSA PCR SCREENING: MRSA by PCR: NEGATIVE

## 2014-10-11 LAB — GLUCOSE, CAPILLARY
GLUCOSE-CAPILLARY: 299 mg/dL — AB (ref 65–99)
Glucose-Capillary: 312 mg/dL — ABNORMAL HIGH (ref 65–99)
Glucose-Capillary: 220 mg/dL — ABNORMAL HIGH (ref 65–99)
Glucose-Capillary: 206 mg/dL — ABNORMAL HIGH (ref 65–99)
Glucose-Capillary: 188 mg/dL — ABNORMAL HIGH (ref 65–99)

## 2014-10-11 LAB — SEDIMENTATION RATE: SED RATE: 7 mm/h (ref 0–16)

## 2014-10-11 LAB — TSH: TSH: 0.061 u[IU]/mL — ABNORMAL LOW (ref 0.350–4.500)

## 2014-10-11 LAB — ANTITHROMBIN III: ANTITHROMB III FUNC: 131 % — AB (ref 75–120)

## 2014-10-11 MED ORDER — ATORVASTATIN CALCIUM 80 MG PO TABS
80.0000 mg | ORAL_TABLET | Freq: Every day | ORAL | Status: DC
  Administered 2014-10-11 – 2014-10-17 (×7): 80 mg via ORAL
  Filled 2014-10-11 (×7): qty 1

## 2014-10-11 MED ORDER — ASPIRIN 325 MG PO TABS
325.0000 mg | ORAL_TABLET | Freq: Every day | ORAL | Status: DC
  Administered 2014-10-11 – 2014-10-17 (×7): 325 mg via ORAL
  Filled 2014-10-11 (×7): qty 1

## 2014-10-11 MED ORDER — SODIUM CHLORIDE 0.9 % IV SOLN
INTRAVENOUS | Status: DC
  Administered 2014-10-11: 07:00:00 via INTRAVENOUS

## 2014-10-11 MED ORDER — INSULIN ASPART 100 UNIT/ML ~~LOC~~ SOLN
0.0000 [IU] | Freq: Three times a day (TID) | SUBCUTANEOUS | Status: DC
  Administered 2014-10-11: 5 [IU] via SUBCUTANEOUS
  Administered 2014-10-12 (×2): 3 [IU] via SUBCUTANEOUS
  Administered 2014-10-12: 5 [IU] via SUBCUTANEOUS
  Administered 2014-10-13 (×3): 2 [IU] via SUBCUTANEOUS
  Administered 2014-10-14: 3 [IU] via SUBCUTANEOUS
  Administered 2014-10-14: 2 [IU] via SUBCUTANEOUS
  Administered 2014-10-14: 3 [IU] via SUBCUTANEOUS
  Administered 2014-10-15: 2 [IU] via SUBCUTANEOUS
  Administered 2014-10-15 (×2): 3 [IU] via SUBCUTANEOUS
  Administered 2014-10-16: 8 [IU] via SUBCUTANEOUS
  Administered 2014-10-16: 3 [IU] via SUBCUTANEOUS
  Administered 2014-10-16 – 2014-10-17 (×2): 2 [IU] via SUBCUTANEOUS
  Administered 2014-10-17: 3 [IU] via SUBCUTANEOUS

## 2014-10-11 MED ORDER — STROKE: EARLY STAGES OF RECOVERY BOOK
Freq: Once | Status: AC
  Administered 2014-10-11: 10:00:00
  Filled 2014-10-11 (×2): qty 1

## 2014-10-11 MED ORDER — PANTOPRAZOLE SODIUM 40 MG PO TBEC
40.0000 mg | DELAYED_RELEASE_TABLET | Freq: Every day | ORAL | Status: DC
  Administered 2014-10-11 – 2014-10-17 (×7): 40 mg via ORAL
  Filled 2014-10-11 (×7): qty 1

## 2014-10-11 MED ORDER — HYDRALAZINE HCL 20 MG/ML IJ SOLN
10.0000 mg | INTRAMUSCULAR | Status: DC | PRN
  Administered 2014-10-12 – 2014-10-14 (×3): 10 mg via INTRAVENOUS
  Filled 2014-10-11 (×3): qty 1

## 2014-10-11 MED ORDER — LABETALOL HCL 5 MG/ML IV SOLN
10.0000 mg | INTRAVENOUS | Status: DC | PRN
  Administered 2014-10-15 (×2): 10 mg via INTRAVENOUS
  Filled 2014-10-11 (×2): qty 4

## 2014-10-11 MED ORDER — PANTOPRAZOLE SODIUM 40 MG IV SOLR
40.0000 mg | Freq: Every day | INTRAVENOUS | Status: DC
  Filled 2014-10-11: qty 40

## 2014-10-11 MED ORDER — INSULIN ASPART 100 UNIT/ML ~~LOC~~ SOLN
0.0000 [IU] | Freq: Three times a day (TID) | SUBCUTANEOUS | Status: DC
  Administered 2014-10-11: 11 [IU] via SUBCUTANEOUS
  Administered 2014-10-11: 5 [IU] via SUBCUTANEOUS

## 2014-10-11 MED ORDER — SENNOSIDES-DOCUSATE SODIUM 8.6-50 MG PO TABS
1.0000 | ORAL_TABLET | Freq: Two times a day (BID) | ORAL | Status: DC
  Administered 2014-10-11 – 2014-10-17 (×13): 1 via ORAL
  Filled 2014-10-11 (×14): qty 1

## 2014-10-11 MED ORDER — PERFLUTREN LIPID MICROSPHERE
1.0000 mL | INTRAVENOUS | Status: AC | PRN
  Administered 2014-10-11: 2 mL via INTRAVENOUS
  Filled 2014-10-11: qty 10

## 2014-10-11 MED ORDER — HEPARIN SODIUM (PORCINE) 5000 UNIT/ML IJ SOLN
5000.0000 [IU] | Freq: Three times a day (TID) | INTRAMUSCULAR | Status: DC
  Administered 2014-10-11 – 2014-10-17 (×19): 5000 [IU] via SUBCUTANEOUS
  Filled 2014-10-11 (×22): qty 1

## 2014-10-11 MED ORDER — SODIUM CHLORIDE 3 % IV SOLN
INTRAVENOUS | Status: DC
  Administered 2014-10-11: 50 mL/h via INTRAVENOUS
  Administered 2014-10-11: 100 mL/h via INTRAVENOUS
  Administered 2014-10-12: 75 mL/h via INTRAVENOUS
  Administered 2014-10-12 (×2): 200 mL/h via INTRAVENOUS
  Administered 2014-10-12: 75 mL/h via INTRAVENOUS
  Administered 2014-10-13 – 2014-10-15 (×11): 100 mL/h via INTRAVENOUS
  Administered 2014-10-15: 50 mL/h via INTRAVENOUS
  Filled 2014-10-11 (×36): qty 500

## 2014-10-11 MED ORDER — ACETAMINOPHEN 650 MG RE SUPP: 650.0000 mg | RECTAL | Status: DC | PRN

## 2014-10-11 MED ORDER — INSULIN ASPART 100 UNIT/ML ~~LOC~~ SOLN: 0.0000 [IU] | Freq: Every day | SUBCUTANEOUS | Status: DC

## 2014-10-11 MED ORDER — ACETAMINOPHEN 325 MG PO TABS
650.0000 mg | ORAL_TABLET | ORAL | Status: DC | PRN
  Administered 2014-10-11 – 2014-10-12 (×2): 650 mg via ORAL
  Filled 2014-10-11 (×3): qty 2

## 2014-10-11 MED ORDER — IOHEXOL 350 MG/ML SOLN
80.0000 mL | Freq: Once | INTRAVENOUS | Status: AC | PRN
  Administered 2014-10-11: 100 mL via INTRAVENOUS

## 2014-10-11 NOTE — Progress Notes (Signed)
Dr. Thad Rangereynolds paged to notify of patient's arrival on 2MW room 3.

## 2014-10-11 NOTE — Progress Notes (Signed)
STROKE TEAM PROGRESS NOTE   SUBJECTIVE (INTERVAL HISTORY) No family is at the bedside.  Overall he feels his condition is stable. He is alert but some lethargy. CT repeat showed enlarged ventricles comparing with baseline but no significant neuro changes. Will start 3% saline.    OBJECTIVE Temp:  [98 F (36.7 C)-98.6 F (37 C)] 98.4 F (36.9 C) (05/26 1149) Pulse Rate:  [48-69] 58 (05/26 1500) Cardiac Rhythm:  [-] Normal sinus rhythm;Sinus bradycardia (05/26 1200) Resp:  [12-21] 18 (05/26 1230) BP: (128-184)/(54-102) 128/54 mmHg (05/26 1500) SpO2:  [94 %-100 %] 99 % (05/26 1500) Weight:  [205 lb (92.987 kg)-207 lb (93.895 kg)] 207 lb (93.895 kg) (05/25 1742)   Recent Labs Lab 10/11/14 0144 10/11/14 0732 10/11/14 1148  GLUCAP 299* 312* 220*    Recent Labs Lab 10/08/14 1023 10/10/14 1714 10/11/14 0810  NA 135 137 132*  K 4.0 3.8 4.2  CL 95* 96* 93*  CO2 GLUCOSE 182* 251* 262*  BUN CREATININE 1.07 1.18 1.01  CALCIUM 9.9 9.4 9.7    Recent Labs Lab 10/08/14 1023 10/10/14 1714  AST 22 26  ALT 26 27  ALKPHOS 66 66  BILITOT 0.5 0.5  PROT 8.7* 8.3*  ALBUMIN 4.9 4.4    Recent Labs Lab 10/08/14 1023 10/10/14 1714 10/11/14 0810  WBC 4.4 8.0 8.4  HGB 15.8 15.9 15.9  HCT 47.1 48.1 46.9  MCV 89.4 89.8 88.2  PLT 213 222 244    Recent Labs Lab 10/08/14 1023  TROPONINI <0.03    Recent Labs  10/10/14 1641  LABPROT 13.6  INR 1.02    Recent Labs  10/10/14 1714  COLORURINE YELLOW*  LABSPEC 1.019  PHURINE 5.0  GLUCOSEU 50*  HGBUR NEGATIVE  BILIRUBINUR NEGATIVE  KETONESUR 1+*  PROTEINUR 30*  NITRITE NEGATIVE  LEUKOCYTESUR NEGATIVE       Component Value Date/Time   CHOL 359* 10/11/2014 0630   TRIG 115 10/11/2014 0630   HDL 55 10/11/2014 0630   CHOLHDL 6.5 10/11/2014 0630   VLDL 23 10/11/2014 0630   LDLCALC 281* 10/11/2014 0630   No results found for: HGBA1C No results found for: LABOPIA, COCAINSCRNUR, LABBENZ, AMPHETMU,  THCU, LABBARB  No results for input(s): ETH in the last 168 hours.  I have personally reviewed the radiological images below and agree with the radiology interpretations.  Ct Angio Head and neck W/cm &/or Wo Cm  10/11/2014   IMPRESSION: Extensive acute infarct in the superior cerebellum bilaterally with mass-effect and compression of the fourth ventricle. There is early hydrocephalus unchanged from yesterday. Small amount of hemorrhage in the cerebellar infarct best seen by MRI.  Bilateral carotid and vertebral arteries widely patent in the neck. No significant intracranial stenosis or mass vascular malformation.  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   Ct Head Wo Contrast  10/10/2014   IMPRESSION: Extensive abnormal attenuation throughout the mid and upper cerebellar hemispheres with relative compression of the fourth ventricle. Early enlargement of the lateral and third ventricles. The appearance most likely is due to acute infarct with venous infarction questioned given the somewhat unusual appearance of these lesions. Would advise brain MRI pre and post-contrast to further evaluate. MR angiography and venography also could be helpful to further assess given this somewhat unusual appearance in the cerebellum. No acute hemorrhage.   10/08/2014    IMPRESSION: Normal head CT.    Mr Angiogram Head Wo Contrast  10/10/2014  IMPRESSION: MRA circle of Willis without evidence for significant proximal stenosis, aneurysm, or branch vessel occlusion     Mr Laqueta Jean Wo Contrast  10/10/2014   IMPRESSION: 1. Extensive patchy areas of acute/subacute infarction involving the posterior circulation. This is most prominent within the superior aspect of the cerebellum bilaterally but also involves the posterior left pons, left thalamus, posterior medial bilateral parietal lobes, and left occipital pole. 2. T2 changes and leptomeningeal enhancement suggest a subacute time  frame. 3. There is some hemorrhagic conversion of the superior cerebellar infarcts with blood products noted. 4. Mild periventricular white matter changes are noted otherwise. 5. Mass effect on the cerebellum with partial effacement of the fourth ventricle and quadrigeminal plate cistern. There is no gross herniation.  Mr Mrv Head Wo Cm  10/10/2014   IMPRESSION: Negative MR venogram   2D Echocardiogram   - Left ventricle: The cavity size was normal. Wall thickness was increased in a pattern of mild LVH. Systolic function was normal. The estimated ejection fraction was in the range of 55% to 60%. Wall motion was normal; there were no regional wall motion abnormalities. Left ventricular diastolic function parameters were normal. Impressions: - No cardiac source of emboli was indentified.  LE venous doppler no DVT  EKG  normal sinus rhythm. For complete results please see formal report.  PHYSICAL EXAM  Temp:  [98 F (36.7 C)-98.6 F (37 C)] 98.4 F (36.9 C) (05/26 1149) Pulse Rate:  [48-69] 58 (05/26 1500) Resp:  [12-21] 18 (05/26 1230) BP: (128-184)/(54-102) 128/54 mmHg (05/26 1500) SpO2:  [94 %-100 %] 99 % (05/26 1500) Weight:  [205 lb (92.987 kg)-207 lb (93.895 kg)] 207 lb (93.895 kg) (05/25 1742)  General - Well nourished, well developed, mild lethargy.  Ophthalmologic - fundi not visualized due to small pupils.  Cardiovascular - Regular rate and rhythm.  Neck - supple, no carotid bruits  Mental Status -  Level of arousal and orientation to place, year and person were intact, but not orientated to month. Language including expression, naming, repetition, comprehension was assessed and found intact, but mild dysarthria.  Cranial Nerves II - XII - II - Visual field intact OU. 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 V - Facial sensation intact bilaterally. VII - Facial movement intact bilaterally. VIII - Hearing & vestibular  intact bilaterally. X - Palate elevates symmetrically. XI - Chin turning & shoulder shrug intact bilaterally. XII - Tongue protrusion intact.  Motor Strength - The patient's strength was normal in all extremities and pronator drift was absent.  Bulk was normal and fasciculations were absent.   Motor Tone - Muscle tone was assessed at the neck and appendages and was normal.  Reflexes - The patient's reflexes were symmetrical in all extremities and he had no pathological reflexes.  Sensory - Light touch, temperature/pinprick were assessed and were symmetrical.    Coordination - The patient had normal movements in the hands and feet with no ataxia or dysmetria.  Tremor was absent.  Gait and Station - not tested due to safety concerns.   ASSESSMENT/PLAN Mr. George Armstrong is a 54 y.o. male with history of HTN and DM admitted for posterior circulation infarcts. Symptoms stable.    Stroke:  Large bilateral cerebellar, small pontine, and b/l punctate PCA infarcts, embolic pattern due to unknown source. Mild hemorrhagic transformation.   MRI  As above  MRA  Negative  MRV negative  CTA head and neck negative  CT  repeat showed early hydrocephalus  2D Echo  unremarkable  LDL 281  HgbA1c pending  LE venous doppler - negative  Hypercoagulable work up pending  Consider TEE and loop recorder on Monday   Heparin subq for VTE prophylaxis  Diet Carb Modified Fluid consistency:: Thin; Room service appropriate?: Yes   no antithrombotic prior to admission, now on aspirin 325 mg orally every day  Patient counseled to be compliant with his antithrombotic medications  Ongoing aggressive stroke risk factor management  Therapy recommendations:  pending  Disposition:  pending  Hydrocephalus  CT showed early hydrocephalus  NSG does not think need EVD now  Pt currently stable mental status  Currently possible 3-4 days out of stroke  Will start 3% saline to help hydrocephalus  to avoid EVD  Diabetes  HgbA1c pending goal < 7.0  Uncontrolled  CBG monitoring  SSI  Will start lantus tomorrow depending today's insulin dose  DM education  Hypertension  Home meds:   HCTZ and lisinopril BP goal 120-160 Currently on hydralazine and labetalol PRN  Stable  Patient counseled to be compliant with his blood pressure medications  Hyperlipidemia  Home meds:  none   LDL 281, goal < 70  Add lipitor 80mg    Continue statin at discharge  Other Stroke Risk Factors  Obesity  Other Active Problems  Hyponatremia  Low TSH   Other Pertinent History    Hospital day # 0  This patient is critically ill due to large cerebellar infarct with hydrocephalus and hemorrhagic transformation and at significant risk of neurological worsening, death form cerebral edema, brain herniation, hydrocephalus, and hemorrhagic transformation. This patient's care requires constant monitoring of vital signs, hemodynamics, respiratory and cardiac monitoring, review of multiple databases, neurological assessment, discussion with family, other specialists and medical decision making of high complexity. I spent 45 minutes of neurocritical care time in the care of this patient.   Marvel PlanJindong Munir Victorian, MD PhD Stroke Neurology 10/11/2014 3:39 PM    To contact Stroke Continuity provider, please refer to [email or web address]. After hours, contact General Neurology

## 2014-10-11 NOTE — Procedures (Signed)
Central Venous Catheter Insertion Procedure Note Rayetta Humphreynthony D Koegel [id number] 05/11/1961  Procedure: Insertion of Central Venous Catheter Indications: Assessment of intravascular volume, Drug and/or fluid administration and Frequent blood sampling  Procedure Details Consent: Risks of procedure as well as the alternatives and risks of each were explained to the (patient/caregiver).  Consent for procedure obtained. Time Out: Verified patient identification, verified procedure, site/side was marked, verified correct patient position, special equipment/implants available, medications/allergies/relevent history reviewed, required imaging and test results available.  Performed  Maximum sterile technique was used including antiseptics, cap, gloves, gown, hand hygiene, mask and sheet. Skin prep: Chlorhexidine; local anesthetic administered A antimicrobial bonded/coated triple lumen catheter was placed in the right internal jugular vein using the Seldinger technique.  Evaluation Blood flow good Complications: No apparent complications Patient did tolerate procedure well. Chest X-ray ordered to verify placement.  CXR: pending.  Procedure performed under direct ultrasound guidance for real time vessel cannulation.      Rutherford Guysahul Desai, [place] [facility]ens[facility] Pager: [phone]  or [phone] 10/11/2014, 10:48 AM   Billy Fischeravid Simonds, MD ; [facility] Mobile [phone].  After 5:30 PM or weekends, call [phone]

## 2014-10-11 NOTE — Progress Notes (Signed)
*  PRELIMINARY RESULTS* Vascular Ultrasound Lower extremity venous duplex has been completed.  Preliminary findings: negative for DVT  Farrel DemarkJill Eunice, RDMS, RVT  10/11/2014, 10:13 AM

## 2014-10-11 NOTE — Progress Notes (Signed)
Echocardiogram 2D Echocardiogram with Definity has been performed.  Nolon RodBrown, Tony 10/11/2014, 10:31 AM

## 2014-10-11 NOTE — H&P (Signed)
Admission H&P    Chief Complaint: Headache, dizziness and lethargy  HPI: George Armstrong is an 54 y.o. male who was noted by his wife to start having trouble with his vision on Thursday evening.  He made a comment that he needed to change the lens on his glasses.  Had no other complaints that day.  On Friday had complaints of dizziness.  This was intermittent and by night time went to bed without further complaints.  On Saturday complained of a headache that was intermittent.  On Sunday the patient had no complaints.  On Monday at 0900 became severely dizzy and was having some trouble getting around.  He was taken to [facility].  They did a scan that was unremarkable and treated his headache.  The patient was very lethargic after his headache treatment and was sent home.  He remained lethargic and slept all day on Tuesday.  Went to his primary again on Wednesday and was referred back to [facility] for further evaluation.  BP elevated on arrival.  Date last known well: Unable to determine Time last known well: Unable to determine tPA Given: No: Presence of hemorrhage, Unclear LKW  Past Medical History  Diagnosis Date  . Diabetes mellitus without complication   . Hypertension     Past Surgical History  Procedure Laterality Date  . Cholecystectomy      Family history on file: Mother deceased from a stroke.  Brother has had a stroke.  Father deceased from lung cancer.    Social History:  reports that he has quit smoking. He has never used smokeless tobacco. He reports that he drinks alcohol. His drug history is not on file.  Allergies: No Known Allergies  Medications Prior to Admission  Medication Sig Dispense Refill  . hydrochlorothiazide (HYDRODIURIL) 25 MG tablet Take 25 mg by mouth daily.    . Hydrocodone-Acetaminophen 7.5-300 MG TABS Take 1 tablet by mouth every 4 (four) hours as needed (for pain).    Marland Kitchen lisinopril (PRINIVIL,ZESTRIL) 10 MG tablet Take 10 mg by mouth daily.    . meclizine  (ANTIVERT) 25 MG tablet Take 1 tablet (25 mg total) by mouth 3 (three) times daily as needed for dizziness or nausea. 30 tablet 1  . metFORMIN (GLUCOPHAGE-XR) 500 MG 24 hr tablet Take 500 mg by mouth daily.       ROS: History obtained from the patient and wife  General ROS: negative for - chills, fatigue, fever, night sweats, weight gain or weight loss Psychological ROS: negative for - behavioral disorder, hallucinations, memory difficulties, mood swings or suicidal ideation Ophthalmic ROS: negative for - blurry vision, double vision, eye pain or loss of vision ENT ROS: tooth pain Allergy and Immunology ROS: negative for - hives or itchy/watery eyes Hematological and Lymphatic ROS: negative for - bleeding problems, bruising or swollen lymph nodes Endocrine ROS: negative for - galactorrhea, hair pattern changes, polydipsia/polyuria or temperature intolerance Respiratory ROS: negative for - cough, hemoptysis, shortness of breath or wheezing Cardiovascular ROS: negative for - chest pain, dyspnea on exertion, edema or irregular heartbeat Gastrointestinal ROS: negative for - abdominal pain, diarrhea, hematemesis, nausea/vomiting or stool incontinence Genito-Urinary ROS: negative for - dysuria, hematuria, incontinence or urinary frequency/urgency Musculoskeletal ROS: negative for - joint swelling or muscular weakness Neurological ROS: as noted in HPI Dermatological ROS: negative for rash and skin lesion changes  Physical Examination: Blood pressure 131/63, pulse 52, resp. rate 17, SpO2 95 %.  General Examination:  HEENT-  Normocephalic, no lesions, without obvious  abnormality.  Normal external eye and conjunctiva.  Normal TM's bilaterally.  Normal auditory canals and external ears. Normal external nose, mucus membranes and septum.  Normal pharynx. Cardiovascular- S1, S2 normal, pulses palpable throughout   Lungs- chest clear, no wheezing, rales, normal symmetric air entry Abdomen- soft,  non-tender; bowel sounds normal; no masses,  no organomegaly Extremities- no edema Lymph-no adenopathy palpable Musculoskeletal-no joint tenderness, deformity or swelling Skin-warm and dry, no hyperpigmentation, vitiligo, or suspicious lesions  Neurological Examination Mental Status: Lethargic.  Reports it is the 44 and that it is March but knows the year.  Speech fluent without evidence of aphasia but markedly dysarthric.  Able to follow 3 step commands without difficulty. Cranial Nerves: II: Discs flat bilaterally; Visual fields grossly normal, pupils equal, round, reactive to light and accommodation III,IV, VI: ptosis not present, extra-ocular motions intact bilaterally V,VII: mild decrease in the right NLF, facial light touch sensation normal bilaterally VIII: hearing normal bilaterally IX,X: gag reflex present XI: bilateral shoulder shrug XII: midline tongue extension Motor: Right : Upper extremity   5/5    Left:     Upper extremity   5/5  Lower extremity   5/5     Lower extremity   5/5 Tone and bulk:normal tone throughout; no atrophy noted Sensory: Pinprick and light touch intact throughout, bilaterally Deep Tendon Reflexes: 2+ and symmetric with absent AJ's bilaterally Plantars: Right: withdrawal   Left: withdrawal Cerebellar: Finger-to-nose with dysmetria bilaterally, normal heel-to-shin testing bilaterally Gait: not tested due to safety concerns    Laboratory Studies:   Basic Metabolic Panel:  Recent Labs Lab 10/08/14 1023 10/10/14 1714  NA 135 137  K 4.0 3.8  CL 95* 96*  CO2 30 28  GLUCOSE 182* 251*  BUN 20 17  CREATININE 1.07 1.18  CALCIUM 9.9 9.4    Liver Function Tests:  Recent Labs Lab 10/08/14 1023 10/10/14 1714  AST 22 26  ALT 26 27  ALKPHOS 66 66  BILITOT 0.5 0.5  PROT 8.7* 8.3*  ALBUMIN 4.9 4.4   No results for input(s): LIPASE, AMYLASE in the last 168 hours. No results for input(s): AMMONIA in the last 168 hours.  CBC:  Recent  Labs Lab 10/08/14 1023 10/10/14 1714  WBC 4.4 8.0  HGB 15.8 15.9  HCT 47.1 48.1  MCV 89.4 89.8  PLT 213 222    Cardiac Enzymes:  Recent Labs Lab 10/08/14 1023  TROPONINI <0.03    BNP: Invalid input(s): POCBNP  CBG:  Recent Labs Lab 10/11/14 0144  GLUCAP 299*    Microbiology: No results found for this or any previous visit.  Coagulation Studies:  Recent Labs  10/10/14 1641  LABPROT 13.6  INR 1.02    Urinalysis:  Recent Labs Lab 10/10/14 1714  COLORURINE YELLOW*  LABSPEC 1.019  PHURINE 5.0  GLUCOSEU 50*  HGBUR NEGATIVE  BILIRUBINUR NEGATIVE  KETONESUR 1+*  PROTEINUR 30*  NITRITE NEGATIVE  LEUKOCYTESUR NEGATIVE    Lipid Panel:  No results found for: CHOL, TRIG, HDL, CHOLHDL, VLDL, LDLCALC  HgbA1C: No results found for: HGBA1C  Urine Drug Screen:  No results found for: LABOPIA, COCAINSCRNUR, LABBENZ, AMPHETMU, THCU, LABBARB  Alcohol Level: No results for input(s): ETH in the last 168 hours.  Other results: EKG: sinus rhythm at 73 bpm.  Imaging: Ct Head Wo Contrast  10/10/2014   CLINICAL DATA:  Vertigo and dysphasia  EXAM: CT HEAD WITHOUT CONTRAST  TECHNIQUE: Contiguous axial images were obtained from the base of the skull through the  vertex without intravenous contrast.  COMPARISON:  Oct 08, 2014  FINDINGS: There is new mild dilatation of the lateral and third ventricles. Fourth ventricle is decompressed. There is extensive decreased attenuation throughout the mid and superior cerebellar hemispheres bilaterally. This finding represents a dramatic change compared to recent prior study. There is no demonstrable acute hemorrhage. There is no midline shift. There is no subdural or epidural fluid collections. No well-defined mass lesion identified.  The bony calvarium appears intact.  The mastoid air cells are clear.  IMPRESSION: Extensive abnormal attenuation throughout the mid and upper cerebellar hemispheres with relative compression of the fourth  ventricle. Early enlargement of the lateral and third ventricles. The appearance most likely is due to acute infarct with venous infarction questioned given the somewhat unusual appearance of these lesions. Would advise brain MRI pre and post-contrast to further evaluate. MR angiography and venography also could be helpful to further assess given this somewhat unusual appearance in the cerebellum. No acute hemorrhage.  These results were called by telephone at the time of interpretation on 10/10/2014 at 4:59 pm to Dr. Jene EveryOBERT KINNER , who verbally acknowledged these results.   Electronically Signed   By: Bretta BangWilliam  Woodruff III M.D.   On: 10/10/2014 17:00   Mr Angiogram Head Wo Contrast  10/10/2014   CLINICAL DATA:  Progressive vertigo.  Altered mental status.  EXAM: MRA HEAD WITHOUT CONTRAST  TECHNIQUE: Angiographic images of the Circle of Willis were obtained using MRA technique without intravenous contrast.  COMPARISON:  MRI brain from the same day.  FINDINGS: Internal carotid arteries are within normal limits from high cervical segments through the ICA termini bilaterally. The A1 and M1 segments are normal. No definite anterior communicating artery is present. The MCA bifurcations are within normal limits. Signal loss in the MCA bifurcation is is felt to be artifactual. The distal branch vessels are intact.  The vertebral arteries are codominant. The left PICA origin is visualized and normal. The right AICA is dominant. The basilar artery is within normal. Both posterior cerebral arteries originate the basilar tip. The PCA branch vessels are intact.  IMPRESSION: MRA circle of Willis without evidence for significant proximal stenosis, aneurysm, or branch vessel occlusion   Electronically Signed   By: Marin Robertshristopher  Mattern M.D.   On: 10/10/2014 19:54   Mr Laqueta JeanBrain W ZOWo Contrast  10/10/2014   CLINICAL DATA:  Progressive vertigo. Abnormal vision. Altered mental status. Abnormal speech. Headache.  EXAM: MRI HEAD  WITHOUT AND WITH CONTRAST  TECHNIQUE: Multiplanar, multiecho pulse sequences of the brain and surrounding structures were obtained without and with intravenous contrast.  CONTRAST:  19mL MULTIHANCE GADOBENATE DIMEGLUMINE 529 MG/ML IV SOLN  COMPARISON:  CT head from the same day.  FINDINGS: The diffusion-weighted images confirm patchy acute infarcts of the cerebellum, more prominent superior than inferior. Scattered blood products are noted throughout the infarcts. A punctate focus of restricted diffusion is present within the medial left thalamus. A punctate infarct is present posteriorly in the left pons. Punctate foci of restricted diffusion are noted within the posterior medial parietal lobes and left occipital pole.  T2 changes are present throughout these infarcts with mass effect in the posterior fossa effacing the fourth ventricle and basal cisterns. There is mass effect on the cerebellar tonsils without herniation.  Flow is present in the major intracranial arteries. Mild periventricular white matter changes are noted bilaterally. The ventricles are of normal size. Insert pass fluid  Postcontrast images demonstrate left at meningeal enhancement within the areas  of acute infarction of the cerebellum. Template [place] enhancement is evident.  IMPRESSION: 1. Extensive patchy areas of acute/subacute infarction involving the posterior circulation. This is most prominent within the superior aspect of the cerebellum bilaterally but also involves the posterior left pons, left thalamus, posterior medial bilateral parietal lobes, and left occipital pole. 2. T2 changes and leptomeningeal enhancement suggest a subacute time frame. 3. There is some hemorrhagic conversion of the superior cerebellar infarcts with blood products noted. 4. Mild periventricular white matter changes are noted otherwise. 5. Mass effect on the cerebellum with partial effacement of the fourth ventricle and quadrigeminal plate cistern. There is no  gross herniation. These results were called by telephone at the time of interpretation on 10/10/2014 at 7:49 pm to Dr. Jene Every , who verbally acknowledged these results.   Electronically Signed   By: Marin Roberts M.D.   On: 10/10/2014 19:51   Mr Mrv Head Wo Cm  10/10/2014   CLINICAL DATA:  Progressive vertigo and altered mental status. Extensive posterior circulation infarcts.  EXAM: MR MRV HEAD WITHOUT CONTRAST  TECHNIQUE: Multiplanar, multisequence MR imaging was performed. No intravenous contrast was administered.  COMPARISON:  MRI brain from the same day.  FINDINGS: The dural sinuses are patent. Straight sinus is patent. The droop cerebral by veins are intact. The transverse sinuses are codominant  IMPRESSION: Negative MR venogram   Electronically Signed   By: Marin Roberts M.D.   On: 10/10/2014 19:52    Assessment: 54 y.o. male presenting with lethargy, confusion, dizziness and headache.  Symptoms have been intermittently present for the past few days.  MRI of the brain reviewed and shows Extensive subacute/acute infarcts in the distribution of the posterior circulation and including the cerebellum bilaterally, the left thalamus, left pons, left occipital lobe and bilateral medial parietal lobes.  There is some evidence of hemorrhagic transformation.  MRV and MRA were unremarkable.    Stroke Risk Factors - hyperlipidemia and hypertension  Plan: 1. HgbA1c, fasting lipid panel 2. CTA of the head and neck   3. PT consult, OT consult, Speech consult 4. Echocardiogram 5. BP control 6. Prophylactic therapy-None 7. NPO until RN stroke swallow screen 8. Telemetry monitoring 9. Frequent neuro checks 10. Neurosurgery aware 11. Sliding scale insulin 12. Admit to ICU  This patient is critically ill and at significant risk of neurological worsening, death and care requires constant monitoring of vital signs, hemodynamics,respiratory and cardiac monitoring, neurological assessment,  discussion with family, other specialists and medical decision making of high complexity. I spent 60 minutes of neurocritical care time  in the care of  this patient.  Thana Farr, MD [facility] [phone] 10/11/2014  5:09 AM

## 2014-10-11 NOTE — Progress Notes (Signed)
Inpatient Diabetes Program Recommendations  AACE/ADA: New Consensus Statement on Inpatient Glycemic Control (2013)  Target Ranges:  Prepandial:   less than 140 mg/dL      Peak postprandial:   less than 180 mg/dL (1-2 hours)      Critically ill patients:  140 - 180 mg/dL   Reason for Assessment: Hyperglycemia Diabetes history: DM2 Outpatient Diabetes medications: metformin 500 Q24H Current orders for Inpatient glycemic control: Novolog moderate tidwc  Results for George Armstrong, George Armstrong (MRN [id number]) as of 10/11/2014 09:37  Ref. Range 10/11/2014 01:44 10/11/2014 07:32  Glucose-Capillary Latest Ref Range: 65-99 mg/dL 409299 (H) 811312 (H)  Results for George Armstrong, George Armstrong (MRN [id number]) as of 10/11/2014 09:37  Ref. Range 10/08/2014 10:23 10/10/2014 17:14  Glucose Latest Ref Range: 65-99 mg/dL 213182 (H) 086251 (H)    Inpatient Diabetes Program Recommendations Insulin - Basal: Consider addition of Lantus 12 units QHS Correction (SSI): Increase Novolog to resistant tidwc and hs HgbA1C: Pending Diet: When advanced, CHO mod med  Note: Will continue to follow. Awaiting HgbA1C results.   Thank you. Ailene Ardshonda Dajohn Ellender, RD, LDN, CDE Inpatient Diabetes Coordinator [phone]

## 2014-10-11 NOTE — Progress Notes (Addendum)
Patient's serum sodium drawn at 1950 was 134 which is below goal range of 150-155.  MD notified and RN instructed to increase rate of 3% Saline to 100 ml/hr.

## 2014-10-12 DIAGNOSIS — G911 Obstructive hydrocephalus: Secondary | ICD-10-CM | POA: Diagnosis present

## 2014-10-12 DIAGNOSIS — I6312 Cerebral infarction due to embolism of basilar artery: Secondary | ICD-10-CM

## 2014-10-12 LAB — BASIC METABOLIC PANEL
ANION GAP: 9 (ref 5–15)
BUN: 16 mg/dL (ref 6–20)
CO2: 26 mmol/L (ref 22–32)
CREATININE: 1.12 mg/dL (ref 0.61–1.24)
Calcium: 8.7 mg/dL — ABNORMAL LOW (ref 8.9–10.3)
Chloride: 106 mmol/L (ref 101–111)
GFR calc Af Amer: >60 mL/min (ref >60)
GFR calc non Af Amer: >60 mL/min (ref >60)
Glucose, Bld: 210 mg/dL — ABNORMAL HIGH (ref 65–99)
Potassium: 4 mmol/L (ref 3.5–5.1)
Sodium: 141 mmol/L (ref 135–145)

## 2014-10-12 LAB — GLUCOSE, CAPILLARY
GLUCOSE-CAPILLARY: 217 mg/dL — AB (ref 65–99)
GLUCOSE-CAPILLARY: 198 mg/dL — AB (ref 65–99)
GLUCOSE-CAPILLARY: 161 mg/dL — AB (ref 65–99)
GLUCOSE-CAPILLARY: 151 mg/dL — AB (ref 65–99)

## 2014-10-12 LAB — LUPUS ANTICOAGULANT PANEL
DRVVT: 38.8 s (ref 0.0–55.1)
PTT LA: 31.1 s (ref 0.0–50.0)

## 2014-10-12 LAB — HEMOGLOBIN A1C
HEMOGLOBIN A1C: 8.8 % — AB (ref 4.8–5.6)
Mean Plasma Glucose: 206 mg/dL

## 2014-10-12 LAB — SODIUM
SODIUM: 142 mmol/L (ref 135–145)
Sodium: 134 mmol/L — ABNORMAL LOW (ref 135–145)
Sodium: 145 mmol/L (ref 135–145)
Sodium: 143 mmol/L (ref 135–145)

## 2014-10-12 LAB — CBC
HCT: 43.2 % (ref 39.0–52.0)
Hemoglobin: 14.5 g/dL (ref 13.0–17.0)
MCH: 30 pg (ref 26.0–34.0)
MCHC: 33.6 g/dL (ref 30.0–36.0)
MCV: 89.3 fL (ref 78.0–100.0)
PLATELETS: 152 10*3/uL (ref 150–400)
RBC: 4.84 MIL/uL (ref 4.22–5.81)
RDW: 13.7 % (ref 11.5–15.5)
WBC: 8.8 10*3/uL (ref 4.0–10.5)

## 2014-10-12 LAB — ANTINUCLEAR ANTIBODIES, IFA: ANA Ab, IFA: NEGATIVE

## 2014-10-12 LAB — PROTEIN S ACTIVITY: PROTEIN S ACTIVITY: 109 % (ref 60–145)

## 2014-10-12 LAB — TSH: TSH: 0.058 u[IU]/mL — AB (ref 0.350–4.500)

## 2014-10-12 LAB — PROTEIN S, TOTAL: Protein S Ag, Total: 200 % — ABNORMAL HIGH (ref 58–150)

## 2014-10-12 LAB — HOMOCYSTEINE: HOMOCYSTEINE-NORM: 14.1 umol/L (ref 0.0–15.0)

## 2014-10-12 LAB — HIGH SENSITIVITY CRP: CRP HIGH SENSITIVITY: 2.04 mg/L (ref 0.00–3.00)

## 2014-10-12 LAB — T4, FREE: FREE T4: 0.7 ng/dL (ref 0.61–1.12)

## 2014-10-12 LAB — PROTEIN C ACTIVITY: Protein C Activity: 218 % — ABNORMAL HIGH (ref 74–151)

## 2014-10-12 MED ORDER — INSULIN GLARGINE 100 UNIT/ML ~~LOC~~ SOLN
8.0000 [IU] | Freq: Every day | SUBCUTANEOUS | Status: DC
  Administered 2014-10-12 – 2014-10-17 (×6): 8 [IU] via SUBCUTANEOUS
  Filled 2014-10-12 (×6): qty 0.08

## 2014-10-12 MED ORDER — LISINOPRIL 10 MG PO TABS
10.0000 mg | ORAL_TABLET | Freq: Every day | ORAL | Status: DC
Start: 2014-10-12 — End: 2014-10-12

## 2014-10-12 MED ORDER — BUTALBITAL-APAP-CAFFEINE 50-325-40 MG PO TABS
1.0000 | ORAL_TABLET | Freq: Three times a day (TID) | ORAL | Status: DC | PRN
  Administered 2014-10-12 – 2014-10-17 (×7): 1 via ORAL
  Filled 2014-10-12 (×7): qty 1

## 2014-10-12 MED ORDER — LISINOPRIL 20 MG PO TABS
20.0000 mg | ORAL_TABLET | Freq: Every day | ORAL | Status: DC
  Administered 2014-10-12 – 2014-10-17 (×6): 20 mg via ORAL
  Filled 2014-10-12 (×6): qty 1

## 2014-10-12 NOTE — Progress Notes (Addendum)
Patient's serum sodium drawn at 0109 remained 134. MD notified and RN instructed to increase hypertonic saline rate to 200 ml/hr.

## 2014-10-12 NOTE — Evaluation (Signed)
Physical Therapy Evaluation Patient Details Name: George Armstrong MRN: [id number] DOB: 01/28/1961 Today's Date: 10/12/2014   History of Present Illness  Pt is a 54 y/o male admitted with HA, dizziness and lethargy. Presented to [facility] x2 and then was found to have extensive subacute/acute infarcts in the posterior circulation. Areas noted include: bilateral cerebellum, L thalamus, L pons, L occipital lobe, and bilateral medial parietal lobes.  Clinical Impression  Pt admitted with above diagnosis. Pt currently with functional limitations due to the deficits listed below (see PT Problem List). At the time of PT eval pt was able to perform transfers and ambulation with min assist for balance and support. +2 helpful for safety/management of lines but not necessary for support. Pt on RA throughout session and sats maintained >95% throughout. Per RN pt was left on RA at end of session. Pt will benefit from skilled PT to increase their independence and safety with mobility to allow discharge to the venue listed below.       Follow Up Recommendations Home health PT;Supervision/Assistance - 24 hour    Equipment Recommendations  Rolling walker with 5" wheels;Other (comment) (Depending on progress with PT)    Recommendations for Other Services       Precautions / Restrictions Precautions Precautions: Fall Restrictions Weight Bearing Restrictions: No      Mobility  Bed Mobility Overal bed mobility: Needs Assistance Bed Mobility: Supine to Sit     Supine to sit: Min assist     General bed mobility comments: Pt was able to transition to EOB with assist for initiation of LE movement as well as trunk support as he elevated to full sitting position.   Transfers Overall transfer level: Needs assistance Equipment used: Rolling walker (2 wheeled) Transfers: Sit to/from Stand Sit to Stand: Min assist         General transfer comment: Assist to power-up to full standing as well as gain and  maintain standing balance prior to initiating gait training.   Ambulation/Gait Ambulation/Gait assistance: Min assist Ambulation Distance (Feet): 125 Feet Assistive device: Rolling walker (2 wheeled) Gait Pattern/deviations: Step-through pattern;Decreased stride length;Ataxic Gait velocity: Decreased Gait velocity interpretation: Below normal speed for age/gender General Gait Details: Pt was able to ambulate with RW and occasional assist for balance and walker positioning. Pt was cued for sequencing and general safety awareness during mobilization. Noted slightly ataxic gait.  Stairs            Wheelchair Mobility    Modified Rankin (Stroke Patients Only) Modified Rankin (Stroke Patients Only) Pre-Morbid Rankin Score: No symptoms Modified Rankin: Moderately severe disability     Balance Overall balance assessment: Needs assistance Sitting-balance support: Feet supported;No upper extremity supported Sitting balance-Leahy Scale: Fair     Standing balance support: Bilateral upper extremity supported Standing balance-Leahy Scale: Poor Standing balance comment: Requires UE support to maintain standing balance.                              Pertinent Vitals/Pain Pain Assessment: No/denies pain    Home Living Family/patient expects to be discharged to:: Private residence Living Arrangements: Spouse/significant other;Children Available Help at Discharge: Family;Available 24 hours/day             Additional Comments: Pt reports that his wife will be available 24 hours at d/c however also states that she works. Need to clarify.     Prior Function Level of Independence: Independent  Comments: Working full time.      Hand Dominance   Dominant Hand: Right    Extremity/Trunk Assessment   Upper Extremity Assessment: Defer to OT evaluation           Lower Extremity Assessment: RLE deficits/detail;LLE deficits/detail      Cervical /  Trunk Assessment: Normal  Communication   Communication: Expressive difficulties (Talking very low, mumbling, )  Cognition Arousal/Alertness: Awake/alert Behavior During Therapy: Flat affect Overall Cognitive Status: Difficult to assess                      General Comments      Exercises        Assessment/Plan    PT Assessment Patient needs continued PT services  PT Diagnosis Difficulty walking;Abnormality of gait   PT Problem List Decreased strength;Decreased range of motion;Decreased activity tolerance;Decreased balance;Decreased mobility;Decreased knowledge of use of DME;Decreased safety awareness;Decreased knowledge of precautions  PT Treatment Interventions DME instruction;Gait training;Stair training;Functional mobility training;Therapeutic activities;Therapeutic exercise;Neuromuscular re-education;Patient/family education   PT Goals (Current goals can be found in the Care Plan section) Acute Rehab PT Goals Patient Stated Goal: Eat his breakfast PT Goal Formulation: With patient Time For Goal Achievement: 10/26/14 Potential to Achieve Goals: Good    Frequency Min 4X/week   Barriers to discharge        Co-evaluation               End of Session Equipment Utilized During Treatment: Gait belt Activity Tolerance: Patient tolerated treatment well Patient left: in chair;with call bell/phone within reach Nurse Communication: Mobility status;Other (comment) (O2 status - per RN pt left on RA)         Time: [id number] PT Time Calculation (min) (ACUTE ONLY): 27 min   Charges:   PT Evaluation $Initial PT Evaluation Tier I: 1 Procedure PT Treatments $Gait Training: 8-22 mins   PT G Codes:        Conni SlipperKirkman, Mardy Lucier 10/12/2014, 10:12 AM   Conni SlipperLaura Jeremy Ditullio, PT, DPT Acute Rehabilitation Services Pager: [phone]

## 2014-10-12 NOTE — Progress Notes (Signed)
STROKE TEAM PROGRESS NOTE   SUBJECTIVE (INTERVAL HISTORY) Family members are is at the bedside.  Overall he feels his condition is improved. He is more awake alert. On 3% saline, sodium trending up.    OBJECTIVE Temp:  [97.6 F (36.4 C)-99 F (37.2 C)] 98.2 F (36.8 C) (05/27 1210) Pulse Rate:  [46-91] 50 (05/27 1400) Cardiac Rhythm:  [-] Sinus bradycardia (05/27 1200) Resp:  [12-25] 14 (05/27 1400) BP: (128-170)/(54-94) 139/67 mmHg (05/27 1400) SpO2:  [94 %-100 %] 98 % (05/27 1400) Weight:  [206 lb 9.1 oz (93.7 kg)] 206 lb 9.1 oz (93.7 kg) (05/27 1200)   Recent Labs Lab 10/11/14 1148 10/11/14 1804 10/11/14 2206 10/12/14 0733 10/12/14 1208  GLUCAP 220* 206* 188* 217* 198*    Recent Labs Lab 10/08/14 1023 10/10/14 1714 10/11/14 0810  10/11/14 1950 10/12/14 0109 10/12/14 0435 10/12/14 0715 10/12/14 1315  NA 135 137 132*  < > 134* 134* 141 145 142  K 4.0 3.8 4.2  --   --   --  4.0  --   --   CL 95* 96* 93*  --   --   --  106  --   --   CO2 --   --   --  26  --   --   GLUCOSE 182* 251* 262*  --   --   --  210*  --   --   BUN --   --   --  16  --   --   CREATININE 1.07 1.18 1.01  --   --   --  1.12  --   --   CALCIUM 9.9 9.4 9.7  --   --   --  8.7*  --   --   < > = values in this interval not displayed.  Recent Labs Lab 10/08/14 1023 10/10/14 1714  AST 22 26  ALT 26 27  ALKPHOS 66 66  BILITOT 0.5 0.5  PROT 8.7* 8.3*  ALBUMIN 4.9 4.4    Recent Labs Lab 10/08/14 1023 10/10/14 1714 10/11/14 0810 10/12/14 0435  WBC 4.4 8.0 8.4 8.8  HGB 15.8 15.9 15.9 14.5  HCT 47.1 48.1 46.9 43.2  MCV 89.4 89.8 88.2 89.3  PLT 213 222 244 152    Recent Labs Lab 10/08/14 1023  TROPONINI <0.03    Recent Labs  10/10/14 1641  LABPROT 13.6  INR 1.02    Recent Labs  10/10/14 1714  COLORURINE YELLOW*  LABSPEC 1.019  PHURINE 5.0  GLUCOSEU 50*  HGBUR NEGATIVE  BILIRUBINUR NEGATIVE  KETONESUR 1+*  PROTEINUR 30*  NITRITE NEGATIVE   LEUKOCYTESUR NEGATIVE       Component Value Date/Time   CHOL 359* 10/11/2014 0630   TRIG 115 10/11/2014 0630   HDL 55 10/11/2014 0630   CHOLHDL 6.5 10/11/2014 0630   VLDL 23 10/11/2014 0630   LDLCALC 281* 10/11/2014 0630   Lab Results  Component Value Date   HGBA1C 8.8* 10/11/2014   No results found for: LABOPIA, COCAINSCRNUR, LABBENZ, AMPHETMU, THCU, LABBARB  No results for input(s): ETH in the last 168 hours.  I have personally reviewed the radiological images below and agree with the radiology interpretations.  Ct Angio Head and neck W/cm &/or Wo Cm  10/11/2014   IMPRESSION: Extensive acute infarct in the superior cerebellum bilaterally with mass-effect and compression of the fourth ventricle. There is early hydrocephalus unchanged from yesterday. Small amount of hemorrhage in  the cerebellar infarct best seen by MRI.  Bilateral carotid and vertebral arteries widely patent in the neck. No significant intracranial stenosis or mass vascular malformation.  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   Ct Head Wo Contrast  10/10/2014   IMPRESSION: Extensive abnormal attenuation throughout the mid and upper cerebellar hemispheres with relative compression of the fourth ventricle. Early enlargement of the lateral and third ventricles. The appearance most likely is due to acute infarct with venous infarction questioned given the somewhat unusual appearance of these lesions. Would advise brain MRI pre and post-contrast to further evaluate. MR angiography and venography also could be helpful to further assess given this somewhat unusual appearance in the cerebellum. No acute hemorrhage.   10/08/2014    IMPRESSION: Normal head CT.    Mr Angiogram Head Wo Contrast  10/10/2014   IMPRESSION: MRA circle of Willis without evidence for significant proximal stenosis, aneurysm, or branch vessel occlusion     Mr Laqueta Jean Wo Contrast  10/10/2014   IMPRESSION: 1.  Extensive patchy areas of acute/subacute infarction involving the posterior circulation. This is most prominent within the superior aspect of the cerebellum bilaterally but also involves the posterior left pons, left thalamus, posterior medial bilateral parietal lobes, and left occipital pole. 2. T2 changes and leptomeningeal enhancement suggest a subacute time frame. 3. There is some hemorrhagic conversion of the superior cerebellar infarcts with blood products noted. 4. Mild periventricular white matter changes are noted otherwise. 5. Mass effect on the cerebellum with partial effacement of the fourth ventricle and quadrigeminal plate cistern. There is no gross herniation.  Mr Mrv Head Wo Cm  10/10/2014   IMPRESSION: Negative MR venogram   2D Echocardiogram   - Left ventricle: The cavity size was normal. Wall thickness was increased in a pattern of mild LVH. Systolic function was normal. The estimated ejection fraction was in the range of 55% to 60%. Wall motion was normal; there were no regional wall motion abnormalities. Left ventricular diastolic function parameters were normal. Impressions: - No cardiac source of emboli was indentified.  LE venous doppler no DVT  EKG  normal sinus rhythm. For complete results please see formal report.  PHYSICAL EXAM  Temp:  [97.6 F (36.4 C)-99 F (37.2 C)] 98.2 F (36.8 C) (05/27 1210) Pulse Rate:  [46-91] 50 (05/27 1400) Resp:  [12-25] 14 (05/27 1400) BP: (128-170)/(54-94) 139/67 mmHg (05/27 1400) SpO2:  [94 %-100 %] 98 % (05/27 1400) Weight:  [206 lb 9.1 oz (93.7 kg)] 206 lb 9.1 oz (93.7 kg) (05/27 1200)  General - Well nourished, well developed, mild lethargy.  Ophthalmologic - fundi not visualized due to small pupils.  Cardiovascular - Regular rate and rhythm.  Neck - supple, no carotid bruits  Mental Status -  Level of arousal and orientation to place, year and person were intact, but not orientated to month. Language  including expression, naming, repetition, comprehension was assessed and found intact, but mild dysarthria.  Cranial Nerves II - XII - II - Visual field intact OU. 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 V - Facial sensation intact bilaterally. VII - Facial movement intact bilaterally. VIII - Hearing & vestibular intact bilaterally. X - Palate elevates symmetrically. XI - Chin turning & shoulder shrug intact bilaterally. XII - Tongue protrusion intact.  Motor Strength - The patient's strength was normal in all extremities and pronator drift was absent.  Bulk was normal and fasciculations were absent.  Motor Tone - Muscle tone was assessed at the neck and appendages and was normal.  Reflexes - The patient's reflexes were symmetrical in all extremities and he had no pathological reflexes.  Sensory - Light touch, temperature/pinprick were assessed and were symmetrical.    Coordination - The patient had normal movements in the hands and feet with no ataxia or dysmetria.  Tremor was absent.  Gait and Station - not tested due to safety concerns.   ASSESSMENT/PLAN Mr. George Armstrong is a 54 y.o. male with history of HTN and DM admitted for posterior circulation infarcts. Symptoms stable.    Stroke:  Large bilateral cerebellar, small pontine, and b/l punctate PCA infarcts, embolic pattern due to unknown source. Mild hemorrhagic transformation.   MRI  As above  MRA  Negative  MRV negative  CTA head and neck negative  CT repeat showed early hydrocephalus  2D Echo  unremarkable  LDL 281  HgbA1c 8.8  LE venous doppler - negative  Hypercoagulable work up so far negative and the rest is pending  Will do TEE and loop recorder on Monday   Heparin subq for VTE prophylaxis  Diet Carb Modified Fluid consistency:: Thin; Room service appropriate?: Yes   no antithrombotic prior to admission, now on aspirin 325 mg orally every day  Patient counseled  to be compliant with his antithrombotic medications  Ongoing aggressive stroke risk factor management  Therapy recommendations:  pending  Disposition:  pending  Hydrocephalus  CT showed early hydrocephalus  NSG does not think need EVD now  Pt currently stable mental status  Currently possible 4-5 days out of stroke  Continue 3% saline to help hydrocephalus to avoid EVD  Diabetes  HgbA1c 8.8 goal < 7.0  Uncontrolled  CBG monitoring  SSI  Start lantus 8 units today  DM education  Hypertension  Home meds:   HCTZ and lisinopril BP goal 120-160 Currently on lisinopril 20  Stable  Patient counseled to be compliant with his blood pressure medications  Hyperlipidemia  Home meds:  none   LDL 281, goal < 70  Add lipitor 80mg    Continue statin at discharge  Other Stroke Risk Factors  Obesity  Other Active Problems  Hyponatremia  Low TSH   Other Pertinent History    Hospital day # 1  This patient is critically ill due to large cerebellar infarct with hydrocephalus and hemorrhagic transformation and at significant risk of neurological worsening, death form cerebral edema, brain herniation, hydrocephalus, and hemorrhagic transformation. This patient's care requires constant monitoring of vital signs, hemodynamics, respiratory and cardiac monitoring, review of multiple databases, neurological assessment, discussion with family, other specialists and medical decision making of high complexity. I spent 35 minutes of neurocritical care time in the care of this patient.   George PlanJindong Laquan Ludden, MD PhD Stroke Neurology 10/12/2014 2:33 PM    To contact Stroke Continuity provider, please refer to [email or web address]. After hours, contact General Neurology

## 2014-10-12 NOTE — Care Management Note (Signed)
Case Management Note  Patient Details  Name: Rayetta Humphreynthony D Liddy MRN: [id number] Date of Birth: 07/12/1960  Subjective/Objective:       Pt adm on 10/11/14 with large cerebellar stroke.  PTA, pt resided at home with spouse.       Action/Plan: Will follow for discharge planning needs as pt progresses.    Expected Discharge Date:                  Expected Discharge Plan:  Home w Home Health Services  In-House Referral:     Discharge planning Services  CM Consult  Post Acute Care Choice:    Choice offered to:     DME Arranged:    DME Agency:     HH Arranged:    HH Agency:     Status of Service:  In process, will continue to follow  Medicare Important Message Given:    Date Medicare IM Given:    Medicare IM give by:    Date Additional Medicare IM Given:    Additional Medicare Important Message give by:     If discussed at Long Length of Stay Meetings, dates discussed:    Additional Comments:  Quintella BatonJulie W. Nakesha Ebrahim, RN, BSN  Trauma/Neuro ICU Case Manager [phone]

## 2014-10-12 NOTE — Care Management Note (Signed)
Case Management Note  Patient Details  Name: [name] MRN: [id number] Date of Birth: 05/28/1960  Subjective/Objective:    Lives with wife at home - independent prior.  PT [place] HH.  [place]  [facility].  [place] [facility] list given                 Action/Plan: CM will continue to follow.   Expected Discharge Date:                  Expected Discharge Plan:  Home w Home Health Services  In-House Referral:     Discharge planning Services     Post Acute Care Choice:    Choice offered to:     DME Arranged:    DME Agency:     HH Arranged:    HH Agency:     Status of Service:  In process, will continue to follow  Medicare Important Message Given:    Date Medicare IM Given:    Medicare IM give by:    Date Additional Medicare IM Given:    Additional Medicare Important Message give by:     If discussed at Long Length of Stay Meetings, dates discussed:    Additional Comments:  Vangie BickerBrown, Mukund Weinreb Jane, RN 10/12/2014, 1:36 PM

## 2014-10-12 NOTE — Evaluation (Signed)
Occupational Therapy Evaluation Patient Details Name: George Armstrong MRN: [id number] DOB: February 05, 1961 Today's Date: 10/12/2014    History of Present Illness Pt is a 54 y/o male admitted with HA, dizziness and lethargy. Presented to [facility] x2 and then was found to have extensive subacute/acute infarcts in the posterior circulation. Areas noted include: bilateral cerebellum, L thalamus, L pons, L occipital lobe, and bilateral medial parietal lobes.   Clinical Impression   Pt was independent prior to admission. Somewhat difficult to understand pt's speech and get thorough home set up. Pt with headache and politely declining OOB activity. Flat affect. Demonstrates good strength and impaired balance and coordination. Fatigues easily.  Will follow acutely.    Follow Up Recommendations  Home health OT;Supervision/Assistance - 24 hour    Equipment Recommendations  Other (comment) (TBD)    Recommendations for Other Services       Precautions / Restrictions Precautions Precautions: Fall      Mobility Bed Mobility   Bed Mobility: Supine to Sit;Sit to Supine     Supine to sit: Min assist Sit to supine: Min guard      Transfers  Not assessed formally, per PT note, pt requires min assist.                    Balance     Sitting balance-Leahy Scale: Fair                                      ADL Overall ADL's : Needs assistance/impaired Eating/Feeding: Set up;Bed level Eating/Feeding Details (indicate cue type and reason): drank from straw Grooming: Oral care;Bed level Grooming Details (indicate cue type and reason): performed oral care with toothette               Lower Body Dressing Details (indicate cue type and reason): demonstrated ability to cross foot over opposite knee with min assist to access feet               General ADL Comments: Limited visit due to pt's headache and declining OOB.     Vision  Intact visual fields, ocular  movements appear intact, grossly. Pt repeatedly closing and rubbing eyes during assessment, reportedly due to HA.   Perception     Praxis      Pertinent Vitals/Pain Pain Assessment: 0-10 Pain Score: 8  Pain Location: head Pain Descriptors / Indicators: Aching Pain Intervention(s): Limited activity within patient's tolerance;Repositioned;Patient requesting pain meds-RN notified     Hand Dominance Right   Extremity/Trunk Assessment Upper Extremity Assessment Upper Extremity Assessment:  (dysmetria, mild incoordination, good strength)   Lower Extremity Assessment Lower Extremity Assessment: Defer to PT evaluation RLE Coordination: decreased fine motor;decreased gross motor LLE Coordination: decreased fine motor;decreased gross motor   Cervical / Trunk Assessment Cervical / Trunk Assessment: Normal   Communication Communication Communication: Expressive difficulties (low volume, mumbles)   Cognition Arousal/Alertness: Awake/alert Behavior During Therapy: Flat affect Overall Cognitive Status: Difficult to assess                     General Comments       Exercises       Shoulder Instructions      Home Living Family/patient expects to be discharged to:: Private residence Living Arrangements: Spouse/significant other;Children Available Help at Discharge: Family;Available 24 hours/day  Bathroom Shower/Tub: Chief Strategy OfficerTub/shower unit   Bathroom Toilet: Standard     Home Equipment: None   Additional Comments: Pt reports that his wife will be available 24 hours at d/c however also states that she works. Need to clarify.       Prior Functioning/Environment Level of Independence: Independent        Comments: Working full time.     OT Diagnosis: Generalized weakness;Acute pain;Ataxia   OT Problem List: Decreased activity tolerance;Impaired balance (sitting and/or standing);Decreased coordination;Decreased knowledge of use of DME or AE;Impaired UE  functional use;Pain   OT Treatment/Interventions: Self-care/ADL training;DME and/or AE instruction;Therapeutic activities;Patient/family education;Balance training    OT Goals(Current goals can be found in the care plan section) Acute Rehab OT Goals Patient Stated Goal: did not state, wants to sleep OT Goal Formulation: With patient Time For Goal Achievement: 10/26/14 Potential to Achieve Goals: Good ADL Goals Pt Will Perform Eating: Independently;sitting Pt Will Perform Grooming: standing;with supervision Pt Will Perform Upper Body Bathing: standing;with supervision Pt Will Perform Lower Body Bathing: with supervision;sit to/from stand Pt Will Perform Upper Body Dressing: with set-up;sitting Pt Will Perform Lower Body Dressing: with supervision;sit to/from stand Pt Will Transfer to Toilet: with supervision;ambulating;regular height toilet Pt Will Perform Toileting - Clothing Manipulation and hygiene: with supervision;sit to/from stand Pt Will Perform Tub/Shower Transfer: Tub transfer;with supervision;ambulating  OT Frequency: Min 3X/week   Barriers to D/C:            Co-evaluation              End of Session Nurse Communication: Patient requests pain meds (ok to give diet ginger ale)  Activity Tolerance: Patient limited by pain Patient left: in bed;with call bell/phone within reach;with bed alarm set   Time: [id number] OT Time Calculation (min): 15 min Charges:  OT General Charges $OT Visit: 1 Procedure OT Evaluation $Initial OT Evaluation Tier I: 1 Procedure G-Codes:    Evern BioMayberry, Shaquila Sigman Lynn 10/12/2014, 3:14 PM  [phone]

## 2014-10-13 LAB — CBC
HCT: 40.3 % (ref 39.0–52.0)
Hemoglobin: 13.4 g/dL (ref 13.0–17.0)
MCH: 29.9 pg (ref 26.0–34.0)
MCHC: 33.3 g/dL (ref 30.0–36.0)
MCV: 90 fL (ref 78.0–100.0)
Platelets: 205 10*3/uL (ref 150–400)
RBC: 4.48 MIL/uL (ref 4.22–5.81)
RDW: 13.8 % (ref 11.5–15.5)
WBC: 7.8 10*3/uL (ref 4.0–10.5)

## 2014-10-13 LAB — BASIC METABOLIC PANEL
Anion gap: 8 (ref 5–15)
BUN: 14 mg/dL (ref 6–20)
CO2: 26 mmol/L (ref 22–32)
CREATININE: 1.09 mg/dL (ref 0.61–1.24)
Calcium: 8.6 mg/dL — ABNORMAL LOW (ref 8.9–10.3)
Chloride: 105 mmol/L (ref 101–111)
GFR calc Af Amer: >60 mL/min (ref >60)
GFR calc non Af Amer: >60 mL/min (ref >60)
Glucose, Bld: 179 mg/dL — ABNORMAL HIGH (ref 65–99)
Potassium: 3.6 mmol/L (ref 3.5–5.1)
SODIUM: 139 mmol/L (ref 135–145)

## 2014-10-13 LAB — GLUCOSE, CAPILLARY
GLUCOSE-CAPILLARY: 148 mg/dL — AB (ref 65–99)
GLUCOSE-CAPILLARY: 131 mg/dL — AB (ref 65–99)
GLUCOSE-CAPILLARY: 135 mg/dL — AB (ref 65–99)
Glucose-Capillary: 147 mg/dL — ABNORMAL HIGH (ref 65–99)

## 2014-10-13 LAB — SODIUM
SODIUM: 141 mmol/L (ref 135–145)
SODIUM: 140 mmol/L (ref 135–145)
Sodium: 140 mmol/L (ref 135–145)
Sodium: 139 mmol/L (ref 135–145)

## 2014-10-13 LAB — T3: T3 TOTAL: 71 ng/dL (ref 71–180)

## 2014-10-13 LAB — PROTEIN C, TOTAL: Protein C, Total: 138 % (ref 70–140)

## 2014-10-13 LAB — BETA-2-GLYCOPROTEIN I ABS, IGG/M/A
Beta-2 Glyco I IgG: <9 GPI IgG units (ref 0–20)
Beta-2-Glycoprotein I IgM: <9 GPI IgM units (ref 0–32)

## 2014-10-13 NOTE — Progress Notes (Signed)
STROKE TEAM PROGRESS NOTE   SUBJECTIVE (INTERVAL HISTORY) The patient's wife and son are at the bedside. The patient is awake alert and following commands. Multiple questions answered.   OBJECTIVE Temp:  [98.6 F (37 C)-99.1 F (37.3 C)] 99.1 F (37.3 C) (05/28 0400) Pulse Rate:  [40-52] 48 (05/28 1000) Cardiac Rhythm:  [-] Sinus bradycardia (05/27 2000) Resp:  [6-19] 16 (05/28 1000) BP: (129-167)/(58-87) 151/79 mmHg (05/28 1000) SpO2:  [93 %-99 %] 97 % (05/28 1000)   Recent Labs Lab 10/12/14 1208 10/12/14 1655 10/12/14 2140 10/13/14 0822 10/13/14 1211  GLUCAP 198* 161* 151* 147* 148*    Recent Labs Lab 10/08/14 1023 10/10/14 1714 10/11/14 0810  10/12/14 0435  10/12/14 1315 10/12/14 1900 10/13/14 0050 10/13/14 0124 10/13/14 0650  NA 135 137 132*  < > 141  < > 142 143 140 139 141  K 4.0 3.8 4.2  --  4.0  --   --   --   --  3.6  --   CL 95* 96* 93*  --  106  --   --   --   --  105  --   CO2 30 28 24   --  26  --   --   --   --  26  --   GLUCOSE 182* 251* 262*  --  210*  --   --   --   --  179*  --   BUN 20 17 14   --  16  --   --   --   --  14  --   CREATININE 1.07 1.18 1.01  --  1.12  --   --   --   --  1.09  --   CALCIUM 9.9 9.4 9.7  --  8.7*  --   --   --   --  8.6*  --   < > = values in this interval not displayed.  Recent Labs Lab 10/08/14 1023 10/10/14 1714  AST 22 26  ALT 26 27  ALKPHOS 66 66  BILITOT 0.5 0.5  PROT 8.7* 8.3*  ALBUMIN 4.9 4.4    Recent Labs Lab 10/08/14 1023 10/10/14 1714 10/11/14 0810 10/12/14 0435 10/13/14 0124  WBC 4.4 8.0 8.4 8.8 7.8  HGB 15.8 15.9 15.9 14.5 13.4  HCT 47.1 48.1 46.9 43.2 40.3  MCV 89.4 89.8 88.2 89.3 90.0  PLT 213 222 244 152 205    Recent Labs Lab 10/08/14 1023  TROPONINI <0.03    Recent Labs  10/10/14 1641  LABPROT 13.6  INR 1.02    Recent Labs  10/10/14 1714  COLORURINE YELLOW*  LABSPEC 1.019  PHURINE 5.0  GLUCOSEU 50*  HGBUR NEGATIVE  BILIRUBINUR NEGATIVE  KETONESUR 1+*   PROTEINUR 30*  NITRITE NEGATIVE  LEUKOCYTESUR NEGATIVE       Component Value Date/Time   CHOL 359* 10/11/2014 0630   TRIG 115 10/11/2014 0630   HDL 55 10/11/2014 0630   CHOLHDL 6.5 10/11/2014 0630   VLDL 23 10/11/2014 0630   LDLCALC 281* 10/11/2014 0630   Lab Results  Component Value Date   HGBA1C 8.8* 10/11/2014   No results found for: LABOPIA, COCAINSCRNUR, LABBENZ, AMPHETMU, THCU, LABBARB  No results for input(s): ETH in the last 168 hours.  I have personally reviewed the radiological images below and agree with the radiology interpretations.  Ct Angio Head and neck W/cm &/or Wo Cm  10/11/2014   IMPRESSION: Extensive acute infarct in the superior cerebellum bilaterally with  mass-effect and compression of the fourth ventricle. There is early hydrocephalus unchanged from yesterday. Small amount of hemorrhage in the cerebellar infarct best seen by MRI.  Bilateral carotid and vertebral arteries widely patent in the neck. No significant intracranial stenosis or mass vascular malformation.  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   Ct Head Wo Contrast  10/10/2014   IMPRESSION: Extensive abnormal attenuation throughout the mid and upper cerebellar hemispheres with relative compression of the fourth ventricle. Early enlargement of the lateral and third ventricles. The appearance most likely is due to acute infarct with venous infarction questioned given the somewhat unusual appearance of these lesions. Would advise brain MRI pre and post-contrast to further evaluate. MR angiography and venography also could be helpful to further assess given this somewhat unusual appearance in the cerebellum. No acute hemorrhage.   10/08/2014    IMPRESSION: Normal head CT.    Mr Angiogram Head Wo Contrast  10/10/2014   IMPRESSION: MRA circle of Willis without evidence for significant proximal stenosis, aneurysm, or branch vessel occlusion     Mr Laqueta Jean Wo  Contrast  10/10/2014   IMPRESSION: 1. Extensive patchy areas of acute/subacute infarction involving the posterior circulation. This is most prominent within the superior aspect of the cerebellum bilaterally but also involves the posterior left pons, left thalamus, posterior medial bilateral parietal lobes, and left occipital pole. 2. T2 changes and leptomeningeal enhancement suggest a subacute time frame. 3. There is some hemorrhagic conversion of the superior cerebellar infarcts with blood products noted. 4. Mild periventricular white matter changes are noted otherwise. 5. Mass effect on the cerebellum with partial effacement of the fourth ventricle and quadrigeminal plate cistern. There is no gross herniation.  Mr Mrv Head Wo Cm  10/10/2014   IMPRESSION: Negative MR venogram   2D Echocardiogram   - Left ventricle: The cavity size was normal. Wall thickness was increased in a pattern of mild LVH. Systolic function was normal. The estimated ejection fraction was in the range of 55% to 60%. Wall motion was normal; there were no regional wall motion abnormalities. Left ventricular diastolic function parameters were normal. Impressions: - No cardiac source of emboli was indentified.  LE venous doppler no DVT  EKG  normal sinus rhythm. For complete results please see formal report.  PHYSICAL EXAM  Temp:  [98.6 F (37 C)-99.1 F (37.3 C)] 99.1 F (37.3 C) (05/28 0400) Pulse Rate:  [40-52] 48 (05/28 1000) Resp:  [6-19] 16 (05/28 1000) BP: (129-167)/(58-87) 151/79 mmHg (05/28 1000) SpO2:  [93 %-99 %] 97 % (05/28 1000)  General - Well nourished, well developed, mild lethargy.  Ophthalmologic - fundi not visualized due to small pupils.  Cardiovascular - Regular rate and rhythm.  Neck - supple, no carotid bruits  Mental Status -  Level of arousal and orientation to place, year and person were intact, but not orientated to month. Language including expression, naming,  repetition, comprehension was assessed and found intact, but mild dysarthria.  Cranial Nerves II - XII - II - Visual field intact OU. 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 V - Facial sensation intact bilaterally. VII - Facial movement intact bilaterally. VIII - Hearing & vestibular intact bilaterally. X - Palate elevates symmetrically. XI - Chin turning & shoulder shrug intact bilaterally. XII - Tongue protrusion intact.  Motor Strength - The patient's strength was normal in all extremities and pronator drift was absent.  Bulk was normal and  fasciculations were absent.   Motor Tone - Muscle tone was assessed at the neck and appendages and was normal.  Reflexes - The patient's reflexes were symmetrical in all extremities and he had no pathological reflexes.  Sensory - Light touch, temperature/pinprick were assessed and were symmetrical.    Coordination - The patient had normal movements in the hands and feet with no ataxia or dysmetria.  Tremor was absent.  Gait and Station - not tested due to safety concerns.   ASSESSMENT/PLAN Mr. George Armstrong is a 54 y.o. male with history of HTN and DM admitted for posterior circulation infarcts. Symptoms stable.    Stroke:  Large bilateral cerebellar, small pontine, and b/l punctate PCA infarcts, embolic pattern due to unknown source. Mild hemorrhagic transformation.   MRI  As above  MRA  Negative  MRV negative  CTA head and neck negative  CT repeat showed early hydrocephalus  2D Echo  unremarkable  LDL 281  HgbA1c 8.8  LE venous doppler - negative  Hypercoagulable work up so far negative and the rest is pending - protein C activity - 218 (H) Protein S - 200 (H)  Will do TEE and loop recorder on Tuesday ( Monday - lab closed for holiday )  Heparin subq for VTE prophylaxis  Diet Carb Modified Fluid consistency:: Thin; Room service appropriate?: Yes   no antithrombotic prior to admission,  now on aspirin 325 mg orally every day  Patient counseled to be compliant with his antithrombotic medications  Ongoing aggressive stroke risk factor management  Therapy recommendations:  Home health physical therapy recommended  Disposition:  pending  Hydrocephalus  CT showed early hydrocephalus  NSG does not think need EVD now  Pt currently stable mental status  Currently possible 4-5 days out of stroke  Continue 3% saline to help hydrocephalus to avoid EVD  Diabetes  HgbA1c 8.8 goal < 7.0  Uncontrolled  CBG monitoring  SSI  Start lantus 8 units today  DM education  Hypertension  Home meds:   HCTZ and lisinopril BP goal 120-160 Currently on lisinopril 20  Stable  Patient counseled to be compliant with his blood pressure medications  Hyperlipidemia  Home meds:  none   LDL 281, goal < 70  Add lipitor    Continue statin at discharge  Other Stroke Risk Factors  Obesity  Other Active Problems  Hyponatremia  Low TSH   Asymptomatic bradycardia - continue to monitor.    Hospital day # 2  The patient is improving. Transfer to the stepdown unit considered; however, the patient is still on hypertonic saline and will need to stay in the ICU for now.  Delton See PA-C [facility] Pager [phone] 10/13/2014, 12:57 PM  .      To contact Stroke Continuity provider, please refer to [email or web address]. After hours, contact General Neurology

## 2014-10-14 DIAGNOSIS — G911 Obstructive hydrocephalus: Secondary | ICD-10-CM

## 2014-10-14 DIAGNOSIS — I639 Cerebral infarction, unspecified: Secondary | ICD-10-CM

## 2014-10-14 LAB — GLUCOSE, CAPILLARY
GLUCOSE-CAPILLARY: 163 mg/dL — AB (ref 65–99)
GLUCOSE-CAPILLARY: 142 mg/dL — AB (ref 65–99)
Glucose-Capillary: 157 mg/dL — ABNORMAL HIGH (ref 65–99)
Glucose-Capillary: 113 mg/dL — ABNORMAL HIGH (ref 65–99)

## 2014-10-14 LAB — SODIUM
SODIUM: 141 mmol/L (ref 135–145)
SODIUM: 140 mmol/L (ref 135–145)
Sodium: 140 mmol/L (ref 135–145)
Sodium: 141 mmol/L (ref 135–145)

## 2014-10-14 LAB — CARDIOLIPIN ANTIBODIES, IGG, IGM, IGA
Anticardiolipin IgA: <9 APL U/mL (ref 0–11)
Anticardiolipin IgM: <9 MPL U/mL (ref 0–12)

## 2014-10-14 NOTE — Progress Notes (Signed)
STROKE TEAM PROGRESS NOTE   SUBJECTIVE (INTERVAL HISTORY) The patient's family is not at the bedside. The patient is awake alert and following commands. No complaints  OBJECTIVE Temp:  [97.9 F (36.6 C)-98.5 F (36.9 C)] 98 F (36.7 C) (05/29 0900) Pulse Rate:  [37-57] 57 (05/29 1400) Cardiac Rhythm:  [-] Sinus bradycardia (05/29 0400) Resp:  [11-18] 17 (05/29 1400) BP: (129-172)/(47-110) 129/110 mmHg (05/29 1400) SpO2:  [95 %-100 %] 98 % (05/29 1400)   Recent Labs Lab 10/13/14 0822 10/13/14 1211 10/13/14 1627 10/13/14 2219 10/14/14 1203  GLUCAP 147* 148* 131* 135* 142*    Recent Labs Lab 10/08/14 1023 10/10/14 1714 10/11/14 0810  10/12/14 0435  10/13/14 0124  10/13/14 1315 10/13/14 1905 10/14/14 0108 10/14/14 0658 10/14/14 1235  NA 135 137 132*  < > 141  < > 139  < > 140 139 141 140 140  K 4.0 3.8 4.2  --  4.0  --  3.6  --   --   --   --   --   --   CL 95* 96* 93*  --  106  --  105  --   --   --   --   --   --   CO2 --  26  --  26  --   --   --   --   --   --   GLUCOSE 182* 251* 262*  --  210*  --  179*  --   --   --   --   --   --   BUN --  16  --  14  --   --   --   --   --   --   CREATININE 1.07 1.18 1.01  --  1.12  --  1.09  --   --   --   --   --   --   CALCIUM 9.9 9.4 9.7  --  8.7*  --  8.6*  --   --   --   --   --   --   < > = values in this interval not displayed.  Recent Labs Lab 10/08/14 1023 10/10/14 1714  AST 22 26  ALT 26 27  ALKPHOS 66 66  BILITOT 0.5 0.5  PROT 8.7* 8.3*  ALBUMIN 4.9 4.4    Recent Labs Lab 10/08/14 1023 10/10/14 1714 10/11/14 0810 10/12/14 0435 10/13/14 0124  WBC 4.4 8.0 8.4 8.8 7.8  HGB 15.8 15.9 15.9 14.5 13.4  HCT 47.1 48.1 46.9 43.2 40.3  MCV 89.4 89.8 88.2 89.3 90.0  PLT 213 222 244 152 205    Recent Labs Lab 10/08/14 1023  TROPONINI <0.03   No results for input(s): LABPROT, INR in the last 72 hours. No results for input(s): COLORURINE, LABSPEC, PHURINE, GLUCOSEU, HGBUR,  BILIRUBINUR, KETONESUR, PROTEINUR, UROBILINOGEN, NITRITE, LEUKOCYTESUR in the last 72 hours.  Invalid input(s): APPERANCEUR     Component Value Date/Time   CHOL 359* 10/11/2014 0630   TRIG 115 10/11/2014 0630   HDL 55 10/11/2014 0630   CHOLHDL 6.5 10/11/2014 0630   VLDL 23 10/11/2014 0630   LDLCALC 281* 10/11/2014 0630   Lab Results  Component Value Date   HGBA1C 8.8* 10/11/2014   No results found for: LABOPIA, COCAINSCRNUR, LABBENZ, AMPHETMU, THCU, LABBARB  No results for input(s): ETH in the last 168 hours.  I have personally reviewed the radiological images below and  agree with the radiology interpretations.  Ct Angio Head and neck W/cm &/or Wo Cm  10/11/2014   IMPRESSION: Extensive acute infarct in the superior cerebellum bilaterally with mass-effect and compression of the fourth ventricle. There is early hydrocephalus unchanged from yesterday. Small amount of hemorrhage in the cerebellar infarct best seen by MRI.  Bilateral carotid and vertebral arteries widely patent in the neck. No significant intracranial stenosis or mass vascular malformation.  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   Ct Head Wo Contrast  10/10/2014   IMPRESSION: Extensive abnormal attenuation throughout the mid and upper cerebellar hemispheres with relative compression of the fourth ventricle. Early enlargement of the lateral and third ventricles. The appearance most likely is due to acute infarct with venous infarction questioned given the somewhat unusual appearance of these lesions. Would advise brain MRI pre and post-contrast to further evaluate. George angiography and venography also could be helpful to further assess given this somewhat unusual appearance in the cerebellum. No acute hemorrhage.   10/08/2014    IMPRESSION: Normal head CT.    George Angiogram Head Wo Contrast  10/10/2014   IMPRESSION: MRA circle of Willis without evidence for significant proximal stenosis,  aneurysm, or branch vessel occlusion     George Laqueta Jean Wo Contrast  10/10/2014   IMPRESSION: 1. Extensive patchy areas of acute/subacute infarction involving the posterior circulation. This is most prominent within the superior aspect of the cerebellum bilaterally but also involves the posterior left pons, left thalamus, posterior medial bilateral parietal lobes, and left occipital pole. 2. T2 changes and leptomeningeal enhancement suggest a subacute time frame. 3. There is some hemorrhagic conversion of the superior cerebellar infarcts with blood products noted. 4. Mild periventricular white matter changes are noted otherwise. 5. Mass effect on the cerebellum with partial effacement of the fourth ventricle and quadrigeminal plate cistern. There is no gross herniation.  George Mrv Head Wo Cm  10/10/2014   IMPRESSION: Negative George venogram   2D Echocardiogram   - Left ventricle: The cavity size was normal. Wall thickness was increased in a pattern of mild LVH. Systolic function was normal. The estimated ejection fraction was in the range of 55% to 60%. Wall motion was normal; there were no regional wall motion abnormalities. Left ventricular diastolic function parameters were normal. Impressions: - No cardiac source of emboli was indentified.  LE venous doppler no DVT  EKG  normal sinus rhythm. For complete results please see formal report.  PHYSICAL EXAM  Temp:  [97.9 F (36.6 C)-98.5 F (36.9 C)] 98 F (36.7 C) (05/29 0900) Pulse Rate:  [37-57] 57 (05/29 1400) Resp:  [11-18] 17 (05/29 1400) BP: (129-172)/(47-110) 129/110 mmHg (05/29 1400) SpO2:  [95 %-100 %] 98 % (05/29 1400)  General - Well nourished, well developed,   Ophthalmologic - fundi not visualized due to small pupils.  Cardiovascular - Regular rate and rhythm.  Neck - supple, no carotid bruits  Mental Status - Awake alert Level of arousal and orientation to place, year and person were intact, but not orientated  to month. Language including expression, naming, repetition, comprehension was assessed and found intact, but mild dysarthria.  Cranial Nerves II - XII - II - Visual field intact OU. 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 Mild saccadic dysmetria to the right V - Facial sensation intact bilaterally. VII - Facial movement intact bilaterally. VIII - Hearing & vestibular intact bilaterally. X - Palate elevates symmetrically. XI -  Chin turning & shoulder shrug intact bilaterally. XII - Tongue protrusion intact.  Motor Strength - The patient's strength was normal in all extremities and pronator drift was absent.  Bulk was normal and fasciculations were absent.   Motor Tone - Muscle tone was assessed at the neck and appendages and was normal.  Reflexes - The patient's reflexes were symmetrical in all extremities and he had no pathological reflexes.  Sensory - Light touch, temperature/pinprick were assessed and were symmetrical.    Coordination - The patient had normal movements in the hands and feet with minimal right finger to nose dysmetria.  Tremor was absent.  Gait and Station - not tested due to safety concerns.   ASSESSMENT/PLAN George. Rayetta Humphreynthony D Armstrong is a 54 y.o. male with history of HTN and DM admitted for posterior circulation infarcts. Symptoms stable.    Stroke:  Large bilateral cerebellar, small pontine, and b/l punctate PCA infarcts, embolic pattern due to unknown source. Mild hemorrhagic transformation.   MRI  As above  MRA  Negative  MRV negative  CTA head and neck negative  CT repeat showed early hydrocephalus  2D Echo  unremarkable  LDL 281  HgbA1c 8.8  LE venous doppler - negative  Hypercoagulable work up so far negative and the rest is pending - protein C activity - 218 (H) Protein S - 200 (H)  Will do TEE and loop recorder on Tuesday ( Monday - lab closed for holiday )  Heparin subq for VTE prophylaxis  Diet Carb  Modified Fluid consistency:: Thin; Room service appropriate?: Yes   no antithrombotic prior to admission, now on aspirin 325 mg orally every day  Patient counseled to be compliant with his antithrombotic medications  Ongoing aggressive stroke risk factor management  Therapy recommendations:  Home health physical therapy recommended  Disposition:  pending  Hydrocephalus  CT showed early hydrocephalus  NSG does not think need EVD now  Pt currently stable mental status  Currently possible 4-5 days out of stroke  Continue 3% saline to help hydrocephalus to avoid EVD  Diabetes  HgbA1c 8.8 goal < 7.0  Uncontrolled  CBG monitoring  SSI  Started lantus 8 units   DM education  Hypertension  Home meds:   HCTZ and lisinopril BP goal 120-160 Currently on lisinopril 20  Stable  Patient counseled to be compliant with his blood pressure medications  Hyperlipidemia  Home meds:  none   LDL 281, goal < 70  Add lipitor 80mg    Continue statin at discharge  Other Stroke Risk Factors  Obesity  Other Active Problems  Hyponatremia  Low TSH   Asymptomatic bradycardia - continue to monitor.    Hospital day # 3  The patient is improving.   however, the patient is still on hypertonic saline and will need to stay in the ICU for now. This patient is critically ill and at significant risk of neurological worsening, death and care requires constant monitoring of vital signs, hemodynamics,respiratory and cardiac monitoring,review of multiple databases, neurological assessment, discussion with family, other specialists and medical decision making of high complexity.I have made any additions or clarifications directly to the above note.  I spent 30 minutes of neurocritical care time  in the care of  this patient.  Delia HeadyPramod Sethi, MD    10/14/2014, 2:08 PM  .      To contact Stroke Continuity provider, please refer to [email or web address]. After hours, contact General  Neurology

## 2014-10-15 ENCOUNTER — Inpatient Hospital Stay (HOSPITAL_COMMUNITY)

## 2014-10-15 LAB — CULTURE, BLOOD (ROUTINE X 2): Culture: NO GROWTH

## 2014-10-15 LAB — GLUCOSE, CAPILLARY
GLUCOSE-CAPILLARY: 162 mg/dL — AB (ref 65–99)
GLUCOSE-CAPILLARY: 182 mg/dL — AB (ref 65–99)
Glucose-Capillary: 130 mg/dL — ABNORMAL HIGH (ref 65–99)
Glucose-Capillary: 189 mg/dL — ABNORMAL HIGH (ref 65–99)

## 2014-10-15 LAB — SODIUM
Sodium: 141 mmol/L (ref 135–145)
Sodium: 138 mmol/L (ref 135–145)
Sodium: 139 mmol/L (ref 135–145)
Sodium: 137 mmol/L (ref 135–145)

## 2014-10-15 NOTE — Evaluation (Signed)
Speech Language Pathology Evaluation Patient Details Name: George Armstrong MRN: [id number] DOB: 09/28/1960 Today's Date: 10/15/2014 Time: [id number]    Problem List:  Patient Active Problem List   Diagnosis Date Noted  . Stroke   . Obstructive hydrocephalus   . Cerebellar infarct 10/11/2014   Past Medical History:  Past Medical History  Diagnosis Date  . Diabetes mellitus without complication   . Hypertension    Past Surgical History:  Past Surgical History  Procedure Laterality Date  . Cholecystectomy     HPI:  Pt is a 54 y/o male admitted with HA, dizziness and lethargy. [facility] and then was found to have extensive subacute/acute infarcts in the posterior circulation. Areas noted include: bilateral cerebellum, L thalamus, L pons, L occipital lobe, and bilateral medial parietal lobes.   Assessment / Plan / Recommendation Clinical Impression  Cognitive-linguistic evaluation complete. Patient tearful during evaluation and with flat affect, unclear if neuro result of CVA vs stress of situation. Potential impairements noted included word finding at the conversation level, decreased speech intelligibility, mild high level problem solving/mathmatical reasoning deficits although again difficult to determine if neuro origin vs stress of situation. Education complete with patient regarding potential impact of CVA on cognitive-linguistic function. Recommend SLP f/u for continued diagnostic treatment.    SLP Assessment  Patient needs continued Speech Lanaguage Pathology Services    Follow Up Recommendations  Inpatient Rehab    Frequency and Duration min 2x/week  2 weeks   Pertinent Vitals/Pain Pain Assessment: No/denies pain   SLP Goals  Potential to Achieve Goals (ACUTE ONLY): Good  SLP Evaluation Prior Functioning  Cognitive/Linguistic Baseline: Within functional limits  Lives With: Spouse Available Help at Discharge: Family;Available 24 hours/day Vocation:  Full time employment   Cognition  Overall Cognitive Status: Impaired/Different from baseline (although difficult to assess) Arousal/Alertness: Awake/alert Orientation Level: Oriented X4 Attention: Sustained Sustained Attention: Appears intact Memory: Appears intact Awareness: Impaired Awareness Impairment: Intellectual impairment Problem Solving: Impaired Problem Solving Impairment: Verbal complex Executive Function: Reasoning Reasoning: Impaired Reasoning Impairment: Verbal complex Behaviors: Other (comment) (flat affect, tearful) Safety/Judgment: Appears intact    Comprehension  Auditory Comprehension Overall Auditory Comprehension: Appears within functional limits for tasks assessed Visual Recognition/Discrimination Discrimination: Not tested Reading Comprehension Reading Status: Within funtional limits    Expression Expression Primary Mode of Expression: Verbal Verbal Expression Overall Verbal Expression: Impaired Initiation: No impairment Level of Generative/Spontaneous Verbalization: Conversation Repetition: No impairment Naming: Impairment Other Naming Comments: word finding errors at the conversation level, complex explaination Verbal Errors: Other (comment) (frustrated) Pragmatics: Impairment Impairments: Abnormal affect;Eye contact Written Expression Dominant Hand: Right   Oral / Motor Oral Motor/Sensory Function Overall Oral Motor/Sensory Function: Appears within functional limits for tasks assessed Motor Speech Overall Motor Speech: Impaired Respiration: Within functional limits Phonation: Low vocal intensity Resonance: Within functional limits Articulation: Impaired Level of Impairment: Word Intelligibility: Intelligibility reduced Word: 0-24% accurate Phrase: 0-24% accurate Sentence: 0-24% accurate Conversation: 0-24% accurate Motor Planning: Witnin functional limits Effective Techniques: Increased vocal intensity;Over-articulate (? baseline)    GO    George LangoLeah Kenyatte Gruber MA, CCC-SLP [phone]  George Armstrong George Armstrong 10/15/2014, 8:47 AM

## 2014-10-15 NOTE — Progress Notes (Signed)
Occupational Therapy Treatment Patient Details Name: George Armstrong MRN: [id number] DOB: October 21, 1960 Today's Date: 10/15/2014    History of present illness Pt is a 54 y/o male admitted with HA, dizziness and lethargy. 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 Areas noted include: bilateral cerebellum, L thalamus, L pons, L occipital lobe, and bilateral medial parietal lobes.   OT comments  Pt demonstrates improving activity tolerance, and improved ability to perform ADLs - currently requires min guard to min A.  He fatigues quickly but unable to determine if he is truly fatigued or just overwhelmed by deficits and situation as pt was very tearful at end of session.  Wife reports they are arranging 24 hour superivison/assist, and feels confident with her ability to assist him at discharge, as she reports was significantly worst 2 days prior to discharge, and has improved considerably.  Recommend HHOT   Follow Up Recommendations  Home health OT;Supervision/Assistance - 24 hour    Equipment Recommendations  Tub/shower seat    Recommendations for Other Services      Precautions / Restrictions Precautions Precautions: Fall       Mobility Bed Mobility Overal bed mobility: Needs Assistance Bed Mobility: Supine to Sit;Sit to Supine     Supine to sit: Min guard Sit to supine: Min guard      Transfers Overall transfer level: Needs assistance Equipment used: 1 person hand held assist Transfers: Sit to/from [employer] Sit to Stand: Min assist Stand pivot transfers: Min assist       General transfer comment: assist for balance. Pt reports room spins when in standing.  Pt instructed in gaze fixation to reduce dizziness, but he reports little to no change     Balance Overall balance assessment: Needs assistance Sitting-balance support: Feet supported Sitting balance-Leahy Scale: Fair      Standing balance support: During functional activity Standing balance-Leahy Scale: Fair                     ADL Overall ADL's : Needs assistance/impaired Eating/Feeding: Set up Eating/Feeding Details (indicate cue type and reason): Pt reports he has been feeding himself, but struggles to hold the utensil  Grooming: Wash/dry hands;Wash/dry face;Oral care;Set up;Supervision/safety;Sitting Grooming Details (indicate cue type and reason): Pt with difficulty manipulating toothbrush  Upper Body Bathing: Set up;Sitting   Lower Body Bathing: Minimal assistance;Sit to/from stand       Lower Body Dressing: Minimal assistance;Sit to/from stand Lower Body Dressing Details (indicate cue type and reason): Pt able to don/doff socks with supervision.  when crossing Lt ankle over Rt knee, pt does loose balance to the right, but is able to self correct  Toilet Transfer: Minimal assistance;Ambulation;Comfort height toilet Toilet Transfer Details (indicate cue type and reason): HHa Toileting- Clothing Manipulation and Hygiene: Minimal assistance;Sit to/from stand       Functional mobility during ADLs: Minimal assistance (Without AE ) General ADL Comments: Pt fatigues quickly with activity, and very emotional and tearful at end of session.  Unsure if fatigue is truly fatigue or if due to pt being overwhelmed with situation       Vision Eye Alignment: Impaired (comment)   Ocular Range of Motion: Within Functional Limits Tracking/Visual Pursuits: Decreased smoothness of horizontal tracking Saccades: Additional eye shifts occurred during testing;Decreased speed of saccadic movement Convergence: Impaired (comment) Diplopia Assessment: Disappears with one eye closed;Objects split side to side;Only with left gaze   Additional Comments:  Pt reports diplopia with Lt inferior vision at all times.  Gaze is dysonjugate.  Mild nystagmus noted end range with EOMs,  Pt wears glasses for far sightedness.   He is able to read with his glasses on until he gets to the bottom of the page and unable to complete last 3-4 lines due to interference by diplopia.   Able to converge to ~14" from nose with is outside normal ranges.    Perception     Praxis      Cognition   Behavior During Therapy: Flat affect Overall Cognitive Status: Difficult to assess                       Extremity/Trunk Assessment               Exercises     Shoulder Instructions       General Comments      Pertinent Vitals/ Pain       Pain Assessment: No/denies pain  Home Living                                          Prior Functioning/Environment              Frequency Min 3X/week     Progress Toward Goals  OT Goals(current goals can now be found in the care plan section)  Progress towards OT goals: Progressing toward goals  ADL Goals Pt Will Perform Eating: Independently;sitting Pt Will Perform Grooming: standing;with supervision Pt Will Perform Upper Body Bathing: standing;with supervision Pt Will Perform Lower Body Bathing: with supervision;sit to/from stand Pt Will Perform Upper Body Dressing: with set-up;sitting Pt Will Perform Lower Body Dressing: with supervision;sit to/from stand Pt Will Transfer to Toilet: with supervision;ambulating;regular height toilet Pt Will Perform Toileting - Clothing Manipulation and hygiene: with supervision;sit to/from stand Pt Will Perform Tub/Shower Transfer: Tub transfer;with supervision;ambulating  Plan Discharge plan remains appropriate    Co-evaluation                 End of Session     Activity Tolerance Patient limited by fatigue   Patient Left in bed;with call bell/phone within reach;with family/visitor present   Nurse Communication Mobility status        Time: [id number] OT Time Calculation (min): 37 min  Charges: OT General Charges $OT Visit: 1 Procedure OT Treatments $Therapeutic Activity: 23-37  mins  George Armstrong M 10/15/2014, 1:36 PM

## 2014-10-15 NOTE — Progress Notes (Signed)
Occupational Therapy progress Note  Pt provided with AE to improve his ability to feed self without spilling and dropping    10/15/14 1620  OT Visit Information  Assistance Needed +1  History of Present Illness Pt is a 54 y/o male admitted with HA, dizziness and lethargy. [facility] and then was found to have extensive subacute/acute infarcts in the posterior circulation. Areas noted include: bilateral cerebellum, L thalamus, L pons, L occipital lobe, and bilateral medial parietal lobes.  OT Time Calculation  OT Start Time (ACUTE ONLY) 1558  OT Stop Time (ACUTE ONLY) 1612  OT Time Calculation (min) 14 min  Precautions  Precautions Fall  Pain Assessment  Pain Assessment No/denies pain  Cognition  Arousal/Alertness Awake/alert  Behavior During Therapy WFL for tasks assessed/performed  Overall Cognitive Status Difficult to assess  Difficult to assess due to Impaired communication  ADL  Eating/Feeding Details (indicate cue type and reason) Pt provided with foam built up handles for utensils and instructed in use.  He was able to simulate feeding reporting improved ability to manipulate utensils.  Pt eager to use them.   OT - End of Session  Activity Tolerance Patient tolerated treatment well  Patient left in bed  OT Assessment/Plan  OT Plan Discharge plan remains appropriate  OT Frequency (ACUTE ONLY) Min 3X/week  Follow Up Recommendations Home health OT;Supervision/Assistance - 24 hour  OT Equipment Tub/shower seat  ADL Goals  Pt Will Perform Eating Independently;sitting  Pt Will Perform Grooming standing;with supervision  Pt Will Perform Upper Body Bathing standing;with supervision  Pt Will Perform Lower Body Bathing with supervision;sit to/from stand  Pt Will Perform Upper Body Dressing with set-up;sitting  Pt Will Perform Lower Body Dressing with supervision;sit to/from stand  Pt Will Transfer to Toilet with supervision;ambulating;regular height toilet  Pt Will  Perform Toileting - Clothing Manipulation and hygiene with supervision;sit to/from stand  Pt Will Perform Tub/Shower Transfer Tub transfer;with supervision;ambulating  OT General Charges  $OT Visit 1 Procedure  OT Treatments  $Self Care/Home Management  8-22 mins  [employer] AmericanWendi Sumaiya Arruda, OTR/L [phone]

## 2014-10-15 NOTE — Progress Notes (Signed)
Physical Therapy Treatment Patient Details Name: George Armstrong MRN: [id number] DOB: 01/28/1961 Today's Date: 10/15/2014    History of Present Illness Pt is a 54 y/o male admitted with HA, dizziness and lethargy. [facility] and then was found to have extensive subacute/acute infarcts in the posterior circulation. Areas noted include: bilateral cerebellum, L thalamus, L pons, L occipital lobe, and bilateral medial parietal lobes.    PT Comments    Progressing well, Emphasis on static and dynamic balance without use of assistive device.   Follow Up Recommendations  Home health PT;Supervision/Assistance - 24 hour     Equipment Recommendations  Rolling walker with 5" wheels;Other (comment) (depends on progress)    Recommendations for Other Services       Precautions / Restrictions Precautions Precautions: Fall Restrictions Weight Bearing Restrictions: No    Mobility  Bed Mobility Overal bed mobility: Needs Assistance Bed Mobility: Supine to Sit;Sit to Supine     Supine to sit: Supervision Sit to supine: Supervision   General bed mobility comments: smooth transitions  Transfers Overall transfer level: Needs assistance Equipment used: None Transfers: Sit to/from Stand Sit to Stand: Min guard Stand pivot transfers: Min assist       General transfer comment: guard for safety,  nothing seems to change or make dizziness worse.  Ambulation/Gait Ambulation/Gait assistance: Min guard Ambulation Distance (Feet): 300 Feet Assistive device: None Gait Pattern/deviations: Step-through pattern;Decreased stride length (mildly ataxic) Gait velocity: able to increase speed appreciably   General Gait Details: Generally steady with occasional mild ataxia.   Stairs            Wheelchair Mobility    Modified Rankin (Stroke Patients Only) Modified Rankin (Stroke Patients Only) Pre-Morbid Rankin Score: No symptoms Modified Rankin: Moderately severe  disability     Balance Overall balance assessment: Needs assistance Sitting-balance support: Feet supported Sitting balance-Leahy Scale: Good     Standing balance support: During functional activity;No upper extremity supported Standing balance-Leahy Scale: Fair Standing balance comment: slow to scan environment, but maintained balance             High level balance activites: Backward walking;Direction changes;Turns;Head turns High Level Balance Comments: pt was slow and mildly unsteady, but no overt LOB.    Cognition Arousal/Alertness: Awake/alert Behavior During Therapy: WFL for tasks assessed/performed Overall Cognitive Status: Difficult to assess                      Exercises      General Comments        Pertinent Vitals/Pain Pain Assessment: No/denies pain    Home Living                      Prior Function            PT Goals (current goals can now be found in the care plan section) Acute Rehab PT Goals PT Goal Formulation: With patient Time For Goal Achievement: 10/26/14 Potential to Achieve Goals: Good Progress towards PT goals: Progressing toward goals    Frequency  Min 4X/week    PT Plan Current plan remains appropriate    Co-evaluation             End of Session   Activity Tolerance: Patient tolerated treatment well Patient left: in bed;with call bell/phone within reach     Time: 1535-1558 PT Time Calculation (min) (ACUTE ONLY): 23 min  Charges:  $Gait Training: 8-22 mins $Therapeutic Activity: 8-22 mins  G Codes:      Darrion Macaulay, Eliseo Gum 10/15/2014, 4:36 PM  \10/15/2014  [place] Bing, PT [phone]  (pager)

## 2014-10-15 NOTE — Progress Notes (Signed)
STROKE TEAM PROGRESS NOTE   SUBJECTIVE (INTERVAL HISTORY) The patient's family is not at the bedside. The patient is awake alert and following commands. No complaints. Serum sodium is at low. Blood pressure well controlled. No complaints  OBJECTIVE Temp:  [97.3 F (36.3 C)-98.8 F (37.1 C)] 98.8 F (37.1 C) (05/30 1129) Pulse Rate:  [43-66] 49 (05/30 1300) Cardiac Rhythm:  [-] Sinus bradycardia (05/30 0800) Resp:  [13-20] 16 (05/30 1300) BP: (114-187)/(49-92) 161/77 mmHg (05/30 1300) SpO2:  [95 %-100 %] 96 % (05/30 1300)   Recent Labs Lab 10/14/14 1203 10/14/14 1724 10/14/14 2155 10/15/14 0803 10/15/14 1128  GLUCAP 142* 157* 113* 130* 162*    Recent Labs Lab 10/10/14 1714 10/11/14 0810  10/12/14 0435  10/13/14 0124  10/14/14 1235 10/14/14 1916 10/15/14 0130 10/15/14 0645 10/15/14 1317  NA 137 132*  < > 141  < > 139  < > 140 141 141 138 139  K 3.8 4.2  --  4.0  --  3.6  --   --   --   --   --   --   CL 96* 93*  --  106  --  105  --   --   --   --   --   --   CO2 28 24  --  26  --  26  --   --   --   --   --   --   GLUCOSE 251* 262*  --  210*  --  179*  --   --   --   --   --   --   BUN 17 14  --  16  --  14  --   --   --   --   --   --   CREATININE 1.18 1.01  --  1.12  --  1.09  --   --   --   --   --   --   CALCIUM 9.4 9.7  --  8.7*  --  8.6*  --   --   --   --   --   --   < > = values in this interval not displayed.  Recent Labs Lab 10/10/14 1714  AST 26  ALT 27  ALKPHOS 66  BILITOT 0.5  PROT 8.3*  ALBUMIN 4.4    Recent Labs Lab 10/10/14 1714 10/11/14 0810 10/12/14 0435 10/13/14 0124  WBC 8.0 8.4 8.8 7.8  HGB 15.9 15.9 14.5 13.4  HCT 48.1 46.9 43.2 40.3  MCV 89.8 88.2 89.3 90.0  PLT 222 244 152 205   No results for input(s): CKTOTAL, CKMB, CKMBINDEX, TROPONINI in the last 168 hours. No results for input(s): LABPROT, INR in the last 72 hours. No results for input(s): COLORURINE, LABSPEC, PHURINE, GLUCOSEU, HGBUR, BILIRUBINUR, KETONESUR,  PROTEINUR, UROBILINOGEN, NITRITE, LEUKOCYTESUR in the last 72 hours.  Invalid input(s): APPERANCEUR     Component Value Date/Time   CHOL 359* 10/11/2014 0630   TRIG 115 10/11/2014 0630   HDL 55 10/11/2014 0630   CHOLHDL 6.5 10/11/2014 0630   VLDL 23 10/11/2014 0630   LDLCALC 281* 10/11/2014 0630   Lab Results  Component Value Date   HGBA1C 8.8* 10/11/2014   No results found for: LABOPIA, COCAINSCRNUR, LABBENZ, AMPHETMU, THCU, LABBARB  No results for input(s): ETH in the last 168 hours.  I have personally reviewed the radiological images below and agree with the radiology interpretations.  Ct Angio Head and neck W/cm &/  or Wo Cm  10/11/2014   IMPRESSION: Extensive acute infarct in the superior cerebellum bilaterally with mass-effect and compression of the fourth ventricle. There is early hydrocephalus unchanged from yesterday. Small amount of hemorrhage in the cerebellar infarct best seen by MRI.  Bilateral carotid and vertebral arteries widely patent in the neck. No significant intracranial stenosis or mass vascular malformation.  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   Ct Head Wo Contrast  10/10/2014   IMPRESSION: Extensive abnormal attenuation throughout the mid and upper cerebellar hemispheres with relative compression of the fourth ventricle. Early enlargement of the lateral and third ventricles. The appearance most likely is due to acute infarct with venous infarction questioned given the somewhat unusual appearance of these lesions. Would advise brain MRI pre and post-contrast to further evaluate. MR angiography and venography also could be helpful to further assess given this somewhat unusual appearance in the cerebellum. No acute hemorrhage.   10/08/2014    IMPRESSION: Normal head CT.    Mr Angiogram Head Wo Contrast  10/10/2014   IMPRESSION: MRA circle of Willis without evidence for significant proximal stenosis, aneurysm, or branch  vessel occlusion     Mr Laqueta Jean Wo Contrast  10/10/2014   IMPRESSION: 1. Extensive patchy areas of acute/subacute infarction involving the posterior circulation. This is most prominent within the superior aspect of the cerebellum bilaterally but also involves the posterior left pons, left thalamus, posterior medial bilateral parietal lobes, and left occipital pole. 2. T2 changes and leptomeningeal enhancement suggest a subacute time frame. 3. There is some hemorrhagic conversion of the superior cerebellar infarcts with blood products noted. 4. Mild periventricular white matter changes are noted otherwise. 5. Mass effect on the cerebellum with partial effacement of the fourth ventricle and quadrigeminal plate cistern. There is no gross herniation.  Mr Mrv Head Wo Cm  10/10/2014   IMPRESSION: Negative MR venogram   2D Echocardiogram   - Left ventricle: The cavity size was normal. Wall thickness was increased in a pattern of mild LVH. Systolic function was normal. The estimated ejection fraction was in the range of 55% to 60%. Wall motion was normal; there were no regional wall motion abnormalities. Left ventricular diastolic function parameters were normal. Impressions: - No cardiac source of emboli was indentified.  LE venous doppler no DVT  EKG  normal sinus rhythm. For complete results please see formal report.  PHYSICAL EXAM  Temp:  [97.3 F (36.3 C)-98.8 F (37.1 C)] 98.8 F (37.1 C) (05/30 1129) Pulse Rate:  [43-66] 49 (05/30 1300) Resp:  [13-20] 16 (05/30 1300) BP: (114-187)/(49-92) 161/77 mmHg (05/30 1300) SpO2:  [95 %-100 %] 96 % (05/30 1300)  General - Well nourished, well developed,   Ophthalmologic - fundi not visualized due to small pupils.  Cardiovascular - Regular rate and rhythm.  Neck - supple, no carotid bruits  Mental Status - Awake alert Level of arousal and orientation to place, year and person were intact,  Language including expression, naming,  repetition, comprehension was assessed and found intact, but mild dysarthria.  Cranial Nerves II - XII - II - Visual field intact OU. 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 Mild saccadic dysmetria to the right V - Facial sensation intact bilaterally. VII - Facial movement intact bilaterally. VIII - Hearing & vestibular intact bilaterally. X - Palate elevates symmetrically. XI - Chin turning & shoulder shrug intact bilaterally. XII - Tongue protrusion intact.  Motor Strength -  The patient's strength was normal in all extremities and pronator drift was absent.  Bulk was normal and fasciculations were absent.   Motor Tone - Muscle tone was assessed at the neck and appendages and was normal.  Reflexes - The patient's reflexes were symmetrical in all extremities and he had no pathological reflexes.  Sensory - Light touch, temperature/pinprick were assessed and were symmetrical.    Coordination - The patient had normal movements in the hands and feet with minimal right finger to nose dysmetria.  Tremor was absent.  Gait and Station - not tested due to safety concerns.   ASSESSMENT/PLAN George Armstrong is a 54 y.o. male with history of HTN and DM admitted for posterior circulation infarcts. Symptoms stable.    Stroke:  Large bilateral cerebellar, small pontine, and b/l punctate PCA infarcts, embolic pattern due to unknown source. Mild hemorrhagic transformation.   MRI  As above  MRA  Negative  MRV negative  CTA head and neck negative  CT repeat showed early hydrocephalus  2D Echo  unremarkable  LDL 281  HgbA1c 8.8  LE venous doppler - negative  Hypercoagulable work up so far negative and the rest is pending - protein C activity - 218 (H) Protein S - 200 (H)  Will do TEE and loop recorder on Tuesday ( Monday - lab closed for holiday )  Heparin subq for VTE prophylaxis  Diet Carb Modified Fluid consistency:: Thin; Room service  appropriate?: Yes   no antithrombotic prior to admission, now on aspirin 325 mg orally every day  Patient counseled to be compliant with his antithrombotic medications  Ongoing aggressive stroke risk factor management  Therapy recommendations:  Home health physical therapy recommended  Disposition:  pending  Hydrocephalus  CT showed early hydrocephalus  NSG does not think need EVD now  Pt currently stable mental status  Currently possible 4-5 days out of stroke  Continue 3% saline to help hydrocephalus to avoid EVD  Diabetes  HgbA1c 8.8 goal < 7.0  Uncontrolled  CBG monitoring  SSI  Started lantus 8 units   DM education  Hypertension  Home meds:   HCTZ and lisinopril BP goal 120-160 Currently on lisinopril 20  Stable  Patient counseled to be compliant with his blood pressure medications  Hyperlipidemia  Home meds:  none   LDL 281, goal < 70  Add lipitor 80mg    Continue statin at discharge  Other Stroke Risk Factors  Obesity  Other Active Problems  Hyponatremia  Low TSH -can treat as outpatient  Asymptomatic bradycardia - continue to monitor.    Hospital day # 4  The patient is improving.   however, the patient is still on hypertonic saline and will need to stay in the ICU for now but will taper hypertonic saline and Dc in am. This patient is critically ill and at significant risk of neurological worsening, death and care requires constant monitoring of vital signs, hemodynamics,respiratory and cardiac monitoring,review of multiple databases, neurological assessment, discussion with family, other specialists and medical decision making of high complexity.I have made any additions or clarifications directly to the above note.  I spent 30 minutes of neurocritical care time  in the care of  this patient.  Delia HeadyPramod Reynolds Kittel, MD    10/15/2014, 3:11 PM  .      To contact Stroke Continuity provider, please refer to [email or web address]. After hours,  contact General Neurology

## 2014-10-16 LAB — GLUCOSE, CAPILLARY
GLUCOSE-CAPILLARY: 185 mg/dL — AB (ref 65–99)
Glucose-Capillary: 127 mg/dL — ABNORMAL HIGH (ref 65–99)
Glucose-Capillary: 270 mg/dL — ABNORMAL HIGH (ref 65–99)
Glucose-Capillary: 155 mg/dL — ABNORMAL HIGH (ref 65–99)

## 2014-10-16 LAB — PROTHROMBIN GENE MUTATION

## 2014-10-16 LAB — SODIUM
SODIUM: 137 mmol/L (ref 135–145)
Sodium: 136 mmol/L (ref 135–145)
Sodium: 138 mmol/L (ref 135–145)
Sodium: 139 mmol/L (ref 135–145)

## 2014-10-16 NOTE — Progress Notes (Signed)
Patient arrived to floor from 3 MW with NT and family present. Patient is alert and oriented, patient was placed on cardiac monitor box 12 and telemetry services notified. VSS. Patient given room information and call bell system explained. Given opportunity to answer questions. Will continue to monitor.

## 2014-10-16 NOTE — Progress Notes (Signed)
Occupational Therapy Treatment Patient Details Name: George Armstrong Mindel MRN: [id number] DOB: 12/30/1960 Today's Date: 10/16/2014    History of present illness Pt is a 54 y/o male admitted with HA, dizziness and lethargy. [facility] and then was found to have extensive subacute/acute infarcts in the posterior circulation. Areas noted include: bilateral cerebellum, L thalamus, L pons, L occipital lobe, and bilateral medial parietal lobes.   OT comments  Pt fatigued after PT.  Pt is now able to feed self with adapted utensils.  Vision mildly improved, but still with diplopia Lt inferior quadrant.  Pt instructed in initial HEP for vision.   Follow Up Recommendations  Home health OT;Supervision/Assistance - 24 hour    Equipment Recommendations  Tub/shower seat    Recommendations for Other Services      Precautions / Restrictions Precautions Precautions: Fall Restrictions Weight Bearing Restrictions: No       Mobility Bed Mobility                  Transfers                      Balance                                   ADL   Eating/Feeding: Modified independent;With adaptive utensils                                     General ADL Comments: Pt eating lunch using foam built up handles.  Reports improved control and abilty to self feed.  Pt instructed in vision HEP.  Reports too fatigued after PT to get OOB again with OT      Vision                 Additional Comments: Pt reports he feels subjectively his vision is improving.  With continues with diplopia Lt inferior quadrant, but now able to fuse vision inconsistently.  Pt intructed in beginning HEP with focus on fusion of vision.  He was able to return demonstration    Perception     Praxis      Cognition   Behavior During Therapy: [facility] for tasks assessed/performed Overall Cognitive Status: Within Functional Limits for tasks assessed                        Extremity/Trunk Assessment               Exercises     Shoulder Instructions       General Comments      Pertinent Vitals/ Pain       Pain Assessment: No/denies pain  Home Living                                          Prior Functioning/Environment              Frequency Min 3X/week     Progress Toward Goals  OT Goals(current goals can now be found in the care plan section)  Progress towards OT goals: Progressing toward goals  ADL Goals Pt Will Perform Eating: Independently;sitting Pt Will Perform Grooming: standing;with supervision Pt Will Perform Upper Body Bathing: standing;with supervision  Pt Will Perform Lower Body Bathing: with supervision;sit to/from stand Pt Will Perform Upper Body Dressing: with set-up;sitting Pt Will Perform Lower Body Dressing: with supervision;sit to/from stand Pt Will Transfer to Toilet: with supervision;ambulating;regular height toilet Pt Will Perform Toileting - Clothing Manipulation and hygiene: with supervision;sit to/from stand Pt Will Perform Tub/Shower Transfer: Tub transfer;with supervision;ambulating  Plan Discharge plan remains appropriate    Co-evaluation                 End of Session     Activity Tolerance Patient limited by fatigue   Patient Left in bed   Nurse Communication          Time: 1610-9604 OT Time Calculation (min): 13 min  Charges: OT General Charges $OT Visit: 1 Procedure OT Treatments $Therapeutic Activity: 8-22 mins  Bassam Dresch M 10/16/2014, 8:45 PM

## 2014-10-16 NOTE — Progress Notes (Signed)
Physical Therapy Treatment Patient Details Name: George Armstrong Summons MRN: [id number] DOB: 02/03/1961 Today's Date: 10/16/2014    History of Present Illness Pt is a 54 y/o male admitted with HA, dizziness and lethargy. Present[facility] to [facility]2 and then was found to have extensive subacute/acute infarcts in the posterior circulation. Areas noted include: bilateral cerebellum, L thalamus, L pons, L occipital lobe, and bilateral medial parietal lobes.    PT Comments    Progressing well.  Completed BERG and DGI which show lower to moderate risks for fall.  Pt progressing quickly to lower risk.  Will do well in Outpatient setting.  Follow Up Recommendations  Outpatient PT     Equipment Recommendations  None recommended by PT    Recommendations for Other Services       Precautions / Restrictions Precautions Precautions: Fall Restrictions Weight Bearing Restrictions: No    Mobility  Bed Mobility   Bed Mobility: Supine to Sit     Supine to sit: Independent        Transfers Overall transfer level: Independent   Transfers: Sit to/from RaytheonStand;Stand Pivot Transfers Sit to Stand: Independent Stand pivot transfers: Independent          Ambulation/Gait Ambulation/Gait assistance: Supervision Ambulation Distance (Feet): 300 Feet Assistive device: None Gait Pattern/deviations: Step-through pattern Gait velocity: able to increase speed appreciably Gait velocity interpretation: at or above normal speed for age/gender General Gait Details: Generally steadier, but more unsteady with scanning and unilateral tasks and gets incrementally worse when "chaining" challenging task after task   Stairs Stairs: Yes Stairs assistance: Supervision Stair Management: One rail Left;Alternating pattern;Forwards Number of Stairs: 5 General stair comments: pt visibly affected by special visual challenges of the stairs, but managed well with use of rail.  Wheelchair Mobility    Modified Rankin  (Stroke Patients Only) Modified Rankin (Stroke Patients Only) Pre-Morbid Rankin Score: No symptoms Modified Rankin: Moderate disability     Balance Overall balance assessment: Needs assistance Sitting-balance support: No upper extremity supported Sitting balance-Leahy Scale: Good     Standing balance support: No upper extremity supported Standing balance-Leahy Scale: Good Standing balance comment: stable static, the more challenges are chained together the more unsteady he gets.                    Cognition Arousal/Alertness: Awake/alert Behavior During Therapy: WFL for tasks assessed/performed Overall Cognitive Status: Within Functional Limits for tasks assessed                      Exercises      General Comments        Pertinent Vitals/Pain Pain Assessment: No/denies pain    Home Living                      Prior Function            PT Goals (current goals can now be found in the care plan section) Acute Rehab PT Goals Patient Stated Goal: back to work, independent, driving PT Goal Formulation: With patient Time For Goal Achievement: 10/26/14 Potential to Achieve Goals: Good Progress towards PT goals: Progressing toward goals    Frequency  Min 4X/week    PT Plan Current plan remains appropriate    Co-evaluation             End of Session   Activity Tolerance: Patient tolerated treatment well Patient left: in bed;with call bell/phone within reach;with family/visitor present  Time: 1610-9604 PT Time Calculation (min) (ACUTE ONLY): 26 min  Charges:  $Gait Training: 8-22 mins $Therapeutic Activity: 8-22 mins                    G Codes:      Tejal Monroy, Eliseo Gum 10/16/2014, 11:07 AM 10/16/2014  Pella Bing, PT [phone]  (pager)

## 2014-10-16 NOTE — Progress Notes (Signed)
STROKE TEAM PROGRESS NOTE   SUBJECTIVE (INTERVAL HISTORY) The patient's family is not at the bedside. The patient is awake alert and following commands. No complaints.  3 % drip being discontinued. low. Blood pressure well controlled. No complaints PT/OT recommended home therapy only.  OBJECTIVE Temp:  [98.1 F (36.7 C)-98.8 F (37.1 C)] 98.6 F (37 C) (05/31 1200) Pulse Rate:  [48-73] 73 (05/31 1000) Cardiac Rhythm:  [-] Normal sinus rhythm (05/31 0800) Resp:  [10-22] 13 (05/31 1000) BP: (139-170)/(64-92) 142/87 mmHg (05/31 1000) SpO2:  [93 %-98 %] 98 % (05/31 1000)   Recent Labs Lab 10/15/14 1128 10/15/14 1807 10/15/14 2146 10/16/14 0714 10/16/14 1157  GLUCAP 162* 182* 189* 127* 185*    Recent Labs Lab 10/10/14 1714 10/11/14 0810  10/12/14 0435  10/13/14 0124  10/15/14 0645 10/15/14 1317 10/15/14 1825 10/16/14 0109 10/16/14 0620  NA 137 132*  < > 141  < > 139  < > 138 139 137 136 137  K 3.8 4.2  --  4.0  --  3.6  --   --   --   --   --   --   CL 96* 93*  --  106  --  105  --   --   --   --   --   --   CO2 28 24  --  26  --  26  --   --   --   --   --   --   GLUCOSE 251* 262*  --  210*  --  179*  --   --   --   --   --   --   BUN 17 14  --  16  --  14  --   --   --   --   --   --   CREATININE 1.18 1.01  --  1.12  --  1.09  --   --   --   --   --   --   CALCIUM 9.4 9.7  --  8.7*  --  8.6*  --   --   --   --   --   --   < > = values in this interval not displayed.  Recent Labs Lab 10/10/14 1714  AST 26  ALT 27  ALKPHOS 66  BILITOT 0.5  PROT 8.3*  ALBUMIN 4.4    Recent Labs Lab 10/10/14 1714 10/11/14 0810 10/12/14 0435 10/13/14 0124  WBC 8.0 8.4 8.8 7.8  HGB 15.9 15.9 14.5 13.4  HCT 48.1 46.9 43.2 40.3  MCV 89.8 88.2 89.3 90.0  PLT 222 244 152 205   No results for input(s): CKTOTAL, CKMB, CKMBINDEX, TROPONINI in the last 168 hours. No results for input(s): LABPROT, INR in the last 72 hours. No results for input(s): COLORURINE, LABSPEC, PHURINE,  GLUCOSEU, HGBUR, BILIRUBINUR, KETONESUR, PROTEINUR, UROBILINOGEN, NITRITE, LEUKOCYTESUR in the last 72 hours.  Invalid input(s): APPERANCEUR     Component Value Date/Time   CHOL 359* 10/11/2014 0630   TRIG 115 10/11/2014 0630   HDL 55 10/11/2014 0630   CHOLHDL 6.5 10/11/2014 0630   VLDL 23 10/11/2014 0630   LDLCALC 281* 10/11/2014 0630   Lab Results  Component Value Date   HGBA1C 8.8* 10/11/2014   No results found for: LABOPIA, COCAINSCRNUR, LABBENZ, AMPHETMU, THCU, LABBARB  No results for input(s): ETH in the last 168 hours.  I have personally reviewed the radiological images below and agree with the radiology  interpretations.  Ct Angio Head and neck W/cm &/or Wo Cm  10/11/2014   IMPRESSION: Extensive acute infarct in the superior cerebellum bilaterally with mass-effect and compression of the fourth ventricle. There is early hydrocephalus unchanged from yesterday. Small amount of hemorrhage in the cerebellar infarct best seen by MRI.  Bilateral carotid and vertebral arteries widely patent in the neck. No significant intracranial stenosis or mass vascular malformation.  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   Ct Head Wo Contrast  10/10/2014   IMPRESSION: Extensive abnormal attenuation throughout the mid and upper cerebellar hemispheres with relative compression of the fourth ventricle. Early enlargement of the lateral and third ventricles. The appearance most likely is due to acute infarct with venous infarction questioned given the somewhat unusual appearance of these lesions. Would advise brain MRI pre and post-contrast to further evaluate. MR angiography and venography also could be helpful to further assess given this somewhat unusual appearance in the cerebellum. No acute hemorrhage.   10/08/2014    IMPRESSION: Normal head CT.    Mr Angiogram Head Wo Contrast  10/10/2014   IMPRESSION: MRA circle of Willis without evidence for significant  proximal stenosis, aneurysm, or branch vessel occlusion     Mr Laqueta JeanBrain W Wo Contrast  10/10/2014   IMPRESSION: 1. Extensive patchy areas of acute/subacute infarction involving the posterior circulation. This is most prominent within the superior aspect of the cerebellum bilaterally but also involves the posterior left pons, left thalamus, posterior medial bilateral parietal lobes, and left occipital pole. 2. T2 changes and leptomeningeal enhancement suggest a subacute time frame. 3. There is some hemorrhagic conversion of the superior cerebellar infarcts with blood products noted. 4. Mild periventricular white matter changes are noted otherwise. 5. Mass effect on the cerebellum with partial effacement of the fourth ventricle and quadrigeminal plate cistern. There is no gross herniation.  Mr Mrv Head Wo Cm  10/10/2014   IMPRESSION: Negative MR venogram   2D Echocardiogram   - Left ventricle: The cavity size was normal. Wall thickness was increased in a pattern of mild LVH. Systolic function was normal. The estimated ejection fraction was in the range of 55% to 60%. Wall motion was normal; there were no regional wall motion abnormalities. Left ventricular diastolic function parameters were normal. Impressions: - No cardiac source of emboli was indentified.  LE venous doppler no DVT  EKG  normal sinus rhythm. For complete results please see formal report.  PHYSICAL EXAM  Temp:  [98.1 F (36.7 C)-98.8 F (37.1 C)] 98.6 F (37 C) (05/31 1200) Pulse Rate:  [48-73] 73 (05/31 1000) Resp:  [10-22] 13 (05/31 1000) BP: (139-170)/(64-92) 142/87 mmHg (05/31 1000) SpO2:  [93 %-98 %] 98 % (05/31 1000)  General - Well nourished, well developed,   Ophthalmologic - fundi not visualized due to small pupils.  Cardiovascular - Regular rate and rhythm.  Neck - supple, no carotid bruits  Mental Status - Awake alert Level of arousal and orientation to place, year and person were intact,   Language including expression, naming, repetition, comprehension was assessed and found intact, but mild dysarthria.  Cranial Nerves II - XII - II - Visual field intact OU. III, IV, VI - Extraocular movements exam showed normal range.y. Mild saccadic dysmetria to the right V - Facial sensation intact bilaterally. VII - Facial movement intact bilaterally. VIII - Hearing & vestibular intact bilaterally. X - Palate elevates symmetrically. XI - Chin turning & shoulder shrug intact bilaterally. XII - Tongue  protrusion intact.  Motor Strength - The patient's strength was normal in all extremities and pronator drift was absent.  Bulk was normal and fasciculations were absent.   Motor Tone - Muscle tone was assessed at the neck and appendages and was normal.  Reflexes - The patient's reflexes were symmetrical in all extremities and he had no pathological reflexes.  Sensory - Light touch, temperature/pinprick were assessed and were symmetrical.    Coordination - The patient had normal movements in the hands and feet with minimal right finger to nose dysmetria.  Tremor was absent.  Gait and Station - not tested due to safety concerns.   ASSESSMENT/PLAN Mr. George Armstrong is a 54 y.o. male with history of HTN and DM admitted for posterior circulation infarcts. Symptoms stable.    Stroke:  Large bilateral cerebellar, small pontine, and b/l punctate PCA infarcts, embolic pattern due to unknown source. Mild hemorrhagic transformation.   MRI  As above  MRA  Negative  MRV negative  CTA head and neck negative  CT repeat showed early hydrocephalus  2D Echo  unremarkable  LDL 281  HgbA1c 8.8  LE venous doppler - negative  Hypercoagulable work up   negative    Will do TEE and loop recorder on Wednesday ( Monday - lab closed for holiday )  Heparin subq for VTE prophylaxis Diet regular Room service appropriate?: Yes; Fluid consistency:: Thin  Diet NPO time specified   no  antithrombotic prior to admission, now on aspirin 325 mg orally every day  Patient counseled to be compliant with his antithrombotic medications  Ongoing aggressive stroke risk factor management  Therapy recommendations:  Home health physical therapy recommended  Disposition:  pending  Hydrocephalus  CT showed early hydrocephalus  NSG does not think need EVD now  Pt currently stable mental status  Currently possible 4-5 days out of stroke  DC 3% saline today  Diabetes  HgbA1c 8.8 goal < 7.0  Uncontrolled  CBG monitoring  SSI  Started lantus 8 units   DM education  Hypertension  Home meds:   HCTZ and lisinopril BP goal 120-160 Currently on lisinopril 20  Stable  Patient counseled to be compliant with his blood pressure medications  Hyperlipidemia  Home meds:  none   LDL 281, goal < 70  Add lipitor    Continue statin at discharge  Other Stroke Risk Factors  Obesity  Other Active Problems  Hyponatremia  Low TSH -can treat as outpatient  Asymptomatic bradycardia - continue to monitor.    Hospital day # 5  The patient is improving.   however, the patient is being tapered off hypertonic saline and will need to transfer to floor after it is discontinued.  TEE and loop recorder tomorrow and DC home after that.  Delia Heady, MD    10/16/2014, 1:31 PM  .      To contact Stroke Continuity provider, please refer to [email or web address]. After hours, contact General Neurology

## 2014-10-16 NOTE — Progress Notes (Signed)
Physical medicine rehabilitation consult requested chart reviewed. As of 10/15/2014 per physical therapy follow-up patient ambulating greater than 300 feet minimal guard without assistive device. Recommendations have been for home health therapies. Hold on formal rehabilitation consult at this time with recommendations discharged to home

## 2014-10-17 ENCOUNTER — Encounter (HOSPITAL_COMMUNITY): Payer: Self-pay | Admitting: Nurse Practitioner

## 2014-10-17 ENCOUNTER — Encounter (HOSPITAL_COMMUNITY)
Admission: AD | Disposition: A | Discharge status: Home / Self Care | Payer: Self-pay | Source: Other Acute Inpatient Hospital | Attending: Neurology

## 2014-10-17 ENCOUNTER — Encounter (HOSPITAL_COMMUNITY)
Admission: AD | Disposition: A | Discharge status: Home / Self Care | Source: Other Acute Inpatient Hospital | Attending: Neurology

## 2014-10-17 ENCOUNTER — Inpatient Hospital Stay (HOSPITAL_COMMUNITY)

## 2014-10-17 DIAGNOSIS — I10 Essential (primary) hypertension: Secondary | ICD-10-CM

## 2014-10-17 DIAGNOSIS — I639 Cerebral infarction, unspecified: Secondary | ICD-10-CM

## 2014-10-17 HISTORY — PX: EP IMPLANTABLE DEVICE: SHX172B

## 2014-10-17 HISTORY — PX: TEE WITHOUT CARDIOVERSION: SHX5443

## 2014-10-17 LAB — SODIUM
Sodium: 138 mmol/L (ref 135–145)
Sodium: 139 mmol/L (ref 135–145)

## 2014-10-17 LAB — GLUCOSE, CAPILLARY
GLUCOSE-CAPILLARY: 139 mg/dL — AB (ref 65–99)
GLUCOSE-CAPILLARY: 165 mg/dL — AB (ref 65–99)
Glucose-Capillary: 206 mg/dL — ABNORMAL HIGH (ref 65–99)

## 2014-10-17 SURGERY — ECHOCARDIOGRAM, TRANSESOPHAGEAL
Anesthesia: Moderate Sedation

## 2014-10-17 SURGERY — LOOP RECORDER INSERTION
Anesthesia: LOCAL

## 2014-10-17 MED ORDER — LISINOPRIL 20 MG PO TABS: 20.0000 mg | ORAL_TABLET | Freq: Every day | ORAL | Status: AC

## 2014-10-17 MED ORDER — LIDOCAINE-EPINEPHRINE 1 %-1:100000 IJ SOLN
INTRAMUSCULAR | Status: AC
  Filled 2014-10-17: qty 1

## 2014-10-17 MED ORDER — FENTANYL CITRATE (PF) 100 MCG/2ML IJ SOLN
INTRAMUSCULAR | Status: AC
  Filled 2014-10-17: qty 2

## 2014-10-17 MED ORDER — ATORVASTATIN CALCIUM 80 MG PO TABS: 80.0000 mg | ORAL_TABLET | Freq: Every day | ORAL | Status: DC

## 2014-10-17 MED ORDER — FENTANYL CITRATE (PF) 100 MCG/2ML IJ SOLN
INTRAMUSCULAR | Status: DC | PRN
  Administered 2014-10-17: 50 ug via INTRAVENOUS

## 2014-10-17 MED ORDER — MIDAZOLAM HCL 5 MG/ML IJ SOLN
INTRAMUSCULAR | Status: AC
  Filled 2014-10-17: qty 2

## 2014-10-17 MED ORDER — MIDAZOLAM HCL 10 MG/2ML IJ SOLN
INTRAMUSCULAR | Status: DC | PRN
Start: 2014-10-17 — End: 2014-10-17
  Administered 2014-10-17: 2 mg via INTRAVENOUS

## 2014-10-17 MED ORDER — ASPIRIN 325 MG PO TABS: 325.0000 mg | ORAL_TABLET | Freq: Every day | ORAL | Status: AC

## 2014-10-17 SURGICAL SUPPLY — 2 items
LOOP REVEAL LINQSYS (Prosthesis & Implant Heart) ×2 IMPLANT
PACK LOOP INSERTION (CUSTOM PROCEDURE TRAY) ×2 IMPLANT

## 2014-10-17 NOTE — Interval H&P Note (Signed)
History and Physical Interval Note:  10/17/2014 8:17 AM  George HumphreyAnthony D Armstrong  has presented today for surgery, with the diagnosis of stroke  The various methods of treatment have been discussed with the patient and family. After consideration of risks, benefits and other options for treatment, the patient has consented to  Procedure(s): TRANSESOPHAGEAL ECHOCARDIOGRAM (TEE) (N/A) as a surgical intervention .  The patient's history has been reviewed, patient examined, no change in status, stable for surgery.  I have reviewed the patient's chart and labs.  Questions were answered to the patient's satisfaction.     Othon Guardia

## 2014-10-17 NOTE — Care Management Note (Signed)
Case Management Note  Patient Details  Name: George Armstrong MRN: [id number] Date of Birth: 12/06/1960  Subjective/Objective:                    Action/Plan:  Met with patient to discuss discharge planning. Patient is agreeable to outpatient therapy and has chosen Outpatient Neurorehabilitation.  Written information was provided and added to AVS.  Patient's information was faxed to neurorehab. Expected Discharge Date:                  Expected Discharge Plan:  Home/Self Care  In-House Referral:     Discharge planning Services  CM Consult  Post Acute Care Choice:    Choice offered to:     DME Arranged:    DME Agency:     HH Arranged:    HH Agency:     Status of Service:  Completed, signed off  Medicare Important Message Given:  No Date Medicare IM Given:    Medicare IM give by:    Date Additional Medicare IM Given:    Additional Medicare Important Message give by:     If discussed at Long Length of Stay Meetings, dates discussed:    Additional Comments:  Robarge, Courtney C, RN 10/17/2014, 4:18 PM  

## 2014-10-17 NOTE — Progress Notes (Signed)
  Echocardiogram Echocardiogram Transesophageal has been performed.  George Armstrong FRANCES 10/17/2014, 10:36 AM

## 2014-10-17 NOTE — Progress Notes (Signed)
STROKE TEAM PROGRESS NOTE   SUBJECTIVE (INTERVAL HISTORY) The patient's wife is   at the bedside. The patient is having TEE today. And loop recorder  Temp:  [97.6 F (36.4 C)-98.7 F (37.1 C)] 97.9 F (36.6 C) (06/01 1024) Pulse Rate:  [49-88] 88 (06/01 1120) Cardiac Rhythm:  [-] Normal sinus rhythm (05/31 2136) Resp:  [7-19] 19 (06/01 1120) BP: (114-198)/(54-110) 139/86 mmHg (06/01 1120) SpO2:  [94 %-100 %] 94 % (06/01 1120)   Recent Labs Lab 10/16/14 0714 10/16/14 1157 10/16/14 1657 10/16/14 2221 10/17/14 0628  GLUCAP 127* 185* 270* 155* 206*    Recent Labs Lab 10/10/14 1714 10/11/14 0810  10/12/14 0435  10/13/14 0124  10/16/14 0620 10/16/14 1319 10/16/14 1900 10/17/14 0113 10/17/14 0856  NA 137 132*  < > 141  < > 139  < > 137 138 139 138 139  K 3.8 4.2  --  4.0  --  3.6  --   --   --   --   --   --   CL 96* 93*  --  106  --  105  --   --   --   --   --   --   CO2 28 24  --  26  --  26  --   --   --   --   --   --   GLUCOSE 251* 262*  --  210*  --  179*  --   --   --   --   --   --   BUN 17 14  --  16  --  14  --   --   --   --   --   --   CREATININE 1.18 1.01  --  1.12  --  1.09  --   --   --   --   --   --   CALCIUM 9.4 9.7  --  8.7*  --  8.6*  --   --   --   --   --   --   < > = values in this interval not displayed.  Recent Labs Lab 10/10/14 1714  AST 26  ALT 27  ALKPHOS 66  BILITOT 0.5  PROT 8.3*  ALBUMIN 4.4    Recent Labs Lab 10/10/14 1714 10/11/14 0810 10/12/14 0435 10/13/14 0124  WBC 8.0 8.4 8.8 7.8  HGB 15.9 15.9 14.5 13.4  HCT 48.1 46.9 43.2 40.3  MCV 89.8 88.2 89.3 90.0  PLT 222 244 152 205   No results for input(s): CKTOTAL, CKMB, CKMBINDEX, TROPONINI in the last 168 hours. No results for input(s): LABPROT, INR in the last 72 hours. No results for input(s): COLORURINE, LABSPEC, PHURINE, GLUCOSEU, HGBUR, BILIRUBINUR, KETONESUR, PROTEINUR, UROBILINOGEN, NITRITE, LEUKOCYTESUR in the last 72 hours.  Invalid input(s): APPERANCEUR      Component Value Date/Time   CHOL 359* 10/11/2014 0630   TRIG 115 10/11/2014 0630   HDL 55 10/11/2014 0630   CHOLHDL 6.5 10/11/2014 0630   VLDL 23 10/11/2014 0630   LDLCALC 281* 10/11/2014 0630   Lab Results  Component Value Date   HGBA1C 8.8* 10/11/2014   No results found for: LABOPIA, COCAINSCRNUR, LABBENZ, AMPHETMU, THCU, LABBARB  No results for input(s): ETH in the last 168 hours.  I have personally reviewed the radiological images below and agree with the radiology interpretations.  Ct Angio Head and neck W/cm &/or Wo Cm  10/11/2014   IMPRESSION: Extensive acute infarct  in the superior cerebellum bilaterally with mass-effect and compression of the fourth ventricle. There is early hydrocephalus unchanged from yesterday. Small amount of hemorrhage in the cerebellar infarct best seen by MRI.  Bilateral carotid and vertebral arteries widely patent in the neck. No significant intracranial stenosis or mass vascular malformation.  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   Ct Head Wo Contrast  10/10/2014   IMPRESSION: Extensive abnormal attenuation throughout the mid and upper cerebellar hemispheres with relative compression of the fourth ventricle. Early enlargement of the lateral and third ventricles. The appearance most likely is due to acute infarct with venous infarction questioned given the somewhat unusual appearance of these lesions. Would advise brain MRI pre and post-contrast to further evaluate. MR angiography and venography also could be helpful to further assess given this somewhat unusual appearance in the cerebellum. No acute hemorrhage.   10/08/2014    IMPRESSION: Normal head CT.    Mr Angiogram Head Wo Contrast  10/10/2014   IMPRESSION: MRA circle of Willis without evidence for significant proximal stenosis, aneurysm, or branch vessel occlusion     Mr Laqueta Jean Wo Contrast  10/10/2014   IMPRESSION: 1. Extensive patchy areas of  acute/subacute infarction involving the posterior circulation. This is most prominent within the superior aspect of the cerebellum bilaterally but also involves the posterior left pons, left thalamus, posterior medial bilateral parietal lobes, and left occipital pole. 2. T2 changes and leptomeningeal enhancement suggest a subacute time frame. 3. There is some hemorrhagic conversion of the superior cerebellar infarcts with blood products noted. 4. Mild periventricular white matter changes are noted otherwise. 5. Mass effect on the cerebellum with partial effacement of the fourth ventricle and quadrigeminal plate cistern. There is no gross herniation.  Mr Mrv Head Wo Cm  10/10/2014   IMPRESSION: Negative MR venogram   2D Echocardiogram   - Left ventricle: The cavity size was normal. Wall thickness was increased in a pattern of mild LVH. Systolic function was normal. The estimated ejection fraction was in the range of 55% to 60%. Wall motion was normal; there were no regional wall motion abnormalities. Left ventricular diastolic function parameters were normal. Impressions: - No cardiac source of emboli was indentified.  LE venous doppler no DVT  EKG  normal sinus rhythm. For complete results please see formal report.  PHYSICAL EXAM  Temp:  [97.6 F (36.4 C)-98.7 F (37.1 C)] 97.9 F (36.6 C) (06/01 1024) Pulse Rate:  [49-88] 88 (06/01 1120) Resp:  [7-19] 19 (06/01 1120) BP: (114-198)/(54-110) 139/86 mmHg (06/01 1120) SpO2:  [94 %-100 %] 94 % (06/01 1120)  General - Well nourished, well developed,   Ophthalmologic - fundi not visualized due to small pupils.  Cardiovascular - Regular rate and rhythm.  Neck - supple, no carotid bruits  Mental Status - Awake alert Level of arousal and orientation to place, year and person were intact,  Language including expression, naming, repetition, comprehension was assessed and found intact,   Cranial Nerves II - XII - II - Visual  field intact OU. III, IV, VI - Extraocular movements exam showed normal range . Mild saccadic dysmetria to the right V - Facial sensation intact bilaterally. VII - Facial movement intact bilaterally. VIII - Hearing & vestibular intact bilaterally. X - Palate elevates symmetrically. XI - Chin turning & shoulder shrug intact bilaterally. XII - Tongue protrusion intact.  Motor Strength - The patient's strength was normal in all extremities and pronator drift was absent.  Bulk was normal and fasciculations were absent.   Motor Tone - Muscle tone was assessed at the neck and appendages and was normal.  Reflexes - The patient's reflexes were symmetrical in all extremities and he had no pathological reflexes.  Sensory - Light touch, temperature/pinprick were assessed and were symmetrical.    Coordination - The patient had normal movements in the hands and feet with minimal right finger to nose dysmetria.  Tremor was absent.  Gait and Station - not tested due to safety concerns.   ASSESSMENT/PLAN Mr. George Armstrong is a 54 y.o. male with history of HTN and DM admitted for posterior circulation infarcts. Symptoms stable.    Stroke:  Large bilateral cerebellar, small pontine, and b/l punctate PCA infarcts, embolic pattern due to unknown source. Mild hemorrhagic transformation.   MRI  As above  MRA  Negative  MRV negative  CTA head and neck negative  CT repeat showed early hydrocephalus  2D Echo  unremarkable  LDL 281  HgbA1c 8.8  LE venous doppler - negative  Hypercoagulable work up   negative    Will do TEE and loop recorder on Wednesday ( Monday - lab closed for holiday )  Heparin subq for VTE prophylaxis  Diet Carb Modified Fluid consistency:: Thin; Room service appropriate?: Yes   no antithrombotic prior to admission, now on aspirin 325 mg orally every day  Patient counseled to be compliant with his antithrombotic medications  Ongoing aggressive stroke risk factor  management  Therapy recommendations:  Home health physical therapy recommended  Disposition:  pending  Hydrocephalus  CT showed early hydrocephalus  NSG does not think need EVD now  Pt currently stable mental status  Currently possible 4-5 days out of stroke  DC 3% saline today  Diabetes  HgbA1c 8.8 goal < 7.0  Uncontrolled  CBG monitoring  SSI  Started lantus 8 units   DM education  Hypertension  Home meds:   HCTZ and lisinopril BP goal 120-160 Currently on lisinopril 20  Stable  Patient counseled to be compliant with his blood pressure medications  Hyperlipidemia  Home meds:  none   LDL 281, goal < 70  Add lipitor 80mg    Continue statin at discharge  Other Stroke Risk Factors  Obesity  Other Active Problems  Hyponatremia  Low TSH -can treat as outpatient  Asymptomatic bradycardia - continue to monitor.    Hospital day # 6  The patient has improved. He has no residual deficits and therapy recommends outpatient PT and OT. Long discussion with patient and wife regarding his risk for recurrent stroke, need for aggressive risk factor modification and answered questions   TEE and loop recorder today and DC home after that.  Delia HeadyPramod Maryssa Giampietro, MD    10/17/2014, 2:00 PM  .      To contact Stroke Continuity provider, please refer to [email or web address]. After hours, contact General Neurology

## 2014-10-17 NOTE — Progress Notes (Signed)
PT Cancellation Note  Patient Details Name: George Armstrong MRN: [id number] DOB: 09/19/1960   Cancelled Treatment:    Reason Eval/Treat Not Completed: Patient at procedure or test/unavailable. Will follow up as able.   Karolee StampsGray, George Armstrong  George Armstrong, PT, DPT  10/17/2014, 11:20 AM

## 2014-10-17 NOTE — Progress Notes (Signed)
Speech Language Pathology Treatment: Cognitive-Linquistic  Patient Details Name: George Armstrong MRN: [id number] DOB: 11/18/1960 Today's Date: 10/17/2014 Time: [id number] SLP Time Calculation (min) (ACUTE ONLY): 38 min  Assessment / Plan / Recommendation Clinical Impression  Pt with much improved speech intelligibility.  Cognition still marked by deficits in high-level problem solving/mathematical reasoning as well as verbal recall.  Pt considers himself to be "70% of normal" with regard to clarity of thinking.  Reviewed FAST acronym; the benefit of OP SLP for initial evaluation to ensure independence with higher level cognition given pt's youth, level of responsibility with work and family.  Pt/family agree.  For D/C this pm.   HPI Other Pertinent Information: Pt is a 54 y/o male admitted with HA, dizziness and lethargy. [facility] and then was found to have extensive subacute/acute infarcts in the posterior circulation. Areas noted include: bilateral cerebellum, L thalamus, L pons, L occipital lobe, and bilateral medial parietal lobes.   Pertinent Vitals    SLP Plan       Recommendations                Follow up Recommendations: Outpatient SLP   George Armstrong L. George Armstrong, [place] CCC/SLP Pager [phone]      George Armstrong, George Armstrong 10/17/2014, 3:59 PM

## 2014-10-17 NOTE — Consult Note (Signed)
ELECTROPHYSIOLOGY CONSULT NOTE  Patient ID: George Armstrong MRN: [id number], DOB/AGE: 52-Feb-1962   Admit date: 10/11/2014 Date of Consult: 10/17/2014  Primary Physician: [name], MD Primary Cardiologist: new to [facility] Reason for Consultation: Cryptogenic stroke; recommendations regarding Implantable Loop Recorder  History of Present Illness George Armstrong was admitted on 10/11/2014 with lethargy, confusion, dizziness, and headache.  Imaging demonstrated large bilateral .  He has undergone workup for stroke including echocardiogram and carotid dopplers.  The patient has been monitored on telemetry which has demonstrated sinus rhythm with no arrhythmias.  Inpatient stroke work-up is to be completed with a TEE.   Echocardiogram this admission demonstrated EF 55-60%, no RWMA.  Lab work is reviewed.  Prior to admission, the patient denies chest pain, shortness of breath, dizziness, palpitations, or syncope.  They are recovering from their stroke with plans to return home at discharge.  EP has been asked to evaluate for placement of an implantable loop recorder to monitor for atrial fibrillation.  ROS is negative except as outlined above.    Past Medical History  Diagnosis Date  . Diabetes mellitus without complication   . Hypertension      Surgical History:  Past Surgical History  Procedure Laterality Date  . Cholecystectomy       Prescriptions prior to admission  Medication Sig Dispense Refill Last Dose  . hydrochlorothiazide (HYDRODIURIL) 25 MG tablet Take 25 mg by mouth daily.   10/10/2014 at Unknown time  . Hydrocodone-Acetaminophen 7.5-300 MG TABS Take 1 tablet by mouth every 4 (four) hours as needed (for pain).   10/10/2014 at Unknown time  . ibuprofen (ADVIL,MOTRIN) 200 MG tablet Take 200 mg by mouth every 6 (six) hours as needed for headache.   Past Week at Unknown time  . lisinopril (PRINIVIL,ZESTRIL) 10 MG tablet Take 10 mg by mouth daily.   10/10/2014  at Unknown time  . meclizine (ANTIVERT) 25 MG tablet Take 1 tablet (25 mg total) by mouth 3 (three) times daily as needed for dizziness or nausea. 30 tablet 1 10/10/2014 at Unknown time  . metFORMIN (GLUCOPHAGE-XR) 500 MG 24 hr tablet Take 500 mg by mouth daily.    10/10/2014 at Unknown time    Inpatient Medications:  . aspirin  325 mg Oral Daily  . atorvastatin  80 mg Oral q1800  . heparin subcutaneous  5,000 Units Subcutaneous 3 times per day  . insulin aspart  0-15 Units Subcutaneous TID WC  . insulin aspart  0-5 Units Subcutaneous QHS  . insulin glargine  8 Units Subcutaneous Daily  . lisinopril  20 mg Oral Daily  . pantoprazole  40 mg Oral Daily  . senna-docusate  1 tablet Oral BID    Allergies: No Known Allergies  History   Social History  . Marital Status: Married    Spouse Name: N/A  . Number of Children: N/A  . Years of Education: N/A   Occupational History  . Not on file.   Social History Main Topics  . Smoking status: Former Games developer  . Smokeless tobacco: Never Used  . Alcohol Use: Yes     Comment: 2 x / month  . Drug Use: Not on file  . Sexual Activity: Yes   Other Topics Concern  . Not on file   Social History Narrative     Family History  Problem Relation Age of Onset  . Stroke Brother   . Stroke Mother   . Cancer Father  Physical Exam: Filed Vitals:   10/16/14 2118 10/16/14 2124 10/17/14 0212 10/17/14 0538  BP:  144/73 138/81 141/88  Pulse:  60 70 64  Temp:  98.4 F (36.9 C) 98.2 F (36.8 C) 98.2 F (36.8 C)  TempSrc:  Oral Oral Oral  Resp: Height:      Weight:      SpO2:  99% 95% 98%    GEN- The patient is well appearing, alert and oriented x 3 today.   Head- normocephalic, atraumatic Eyes-  Sclera clear, conjunctiva pink Ears- hearing intact Oropharynx- clear Neck- supple, Lungs- Clear to ausculation bilaterally, normal work of breathing Heart- Regular rate and rhythm, no murmurs, rubs or gallops, PMI not  laterally displaced GI- soft, NT, ND, + BS Extremities- no clubbing, cyanosis, or edema MS- no significant deformity or atrophy Skin- no rash or lesion Psych- euthymic mood, full affect   Labs:   Lab Results  Component Value Date   WBC 7.8 10/13/2014   HGB 13.4 10/13/2014   HCT 40.3 10/13/2014   MCV 90.0 10/13/2014   PLT 205 10/13/2014    Recent Labs Lab 10/10/14 1714  10/13/14 0124  10/17/14 0113  NA 137  < > 139  < > 138  K 3.8  < > 3.6  --   --   CL 96*  < > 105  --   --   CO2 28  < > 26  --   --   BUN 17  < > 14  --   --   CREATININE 1.18  < > 1.09  --   --   CALCIUM 9.4  < > 8.6*  --   --   PROT 8.3*  --   --   --   --   BILITOT 0.5  --   --   --   --   ALKPHOS 66  --   --   --   --   ALT 27  --   --   --   --   AST 26  --   --   --   --   GLUCOSE 251*  < > 179*  --   --   < > = values in this interval not displayed.   12-lead ECG sinus rhythm, rate 62, normal intervals All prior EKG's in EPIC reviewed with no documented atrial fibrillation  Telemetry sinus rhythm with no atrial arrhythmias  Assessment and Plan:  1. Cryptogenic stroke The patient presents with cryptogenic stroke.  The patient has a TEE planned for this AM.  I spoke at length with the patient about monitoring for afib with either a 30 day event monitor or an implantable loop recorder.  Risks, benefits, and alteratives to implantable loop recorder were discussed with the patient today.   At this time, the patient is very clear in their decision to proceed with implantable loop recorder.   Wound care was reviewed with the patient (keep incision clean and dry for 3 days).  Wound check scheduled for 10/29/14 at 4PM  Please call with questions.   Gypsy Balsam, NP 10/17/2014 7:17 AM   His LDL is >250  Almost certianily will need PCSK-9 therapy  Will defer to PCP  reveiewed the issues of causes of stroke and the implications of the Crystal AF trial and the use of ILR to look for Af as a  potential explanation of cryptogenic stroke  He verbalizes understanding as is willing  and ready to proceed

## 2014-10-17 NOTE — Discharge Summary (Signed)
Stroke Discharge Summary  Patient ID: George Armstrong   MRN: [id number]      DOB: 11/08/1960  Date of Admission: 10/11/2014 Date of Discharge: 10/17/2014  Attending Physician:  Micki Riley, MD, Stroke MD  Consulting Physician(s):     pulmonary/intensive care with Sherryl Manges, MD Patient's PCP:  [name], MD  DISCHARGE DIAGNOSIS:   Large bilateral cerebellar, small pontine, and b/l punctate PCA infarcts, embolic pattern due to unknown source with hemorrhagic transformation,   Obstructive Hydrocephalus  Cytotoxic Cerebral Edema   Induced Hypernatremia  Diabetes, type II  Essential Hypertension  Hyperlipidemia  Obesity, Body mass index is 32.35 kg/(m^2).   Hyponatremia, resolved  Low TSH  Asymptomatic bradycardia    Past Medical History  Diagnosis Date  . Diabetes mellitus without complication   . Hypertension    Past Surgical History  Procedure Laterality Date  . Cholecystectomy    . Ep implantable device N/A 10/17/2014    Procedure: Loop Recorder Insertion;  Surgeon: Duke Salvia, MD;  Location: [facility] INVASIVE CV LAB;  Service: Cardiovascular;  Laterality: N/A;      Medication List    TAKE these medications        aspirin 325 MG tablet  Take 1 tablet (325 mg total) by mouth daily.     atorvastatin 80 MG tablet  Commonly known as:  LIPITOR  Take 1 tablet (80 mg total) by mouth daily at 6 PM.     hydrochlorothiazide 25 MG tablet  Commonly known as:  HYDRODIURIL  Take 25 mg by mouth daily.     Hydrocodone-Acetaminophen 7.5-300 MG Tabs  Take 1 tablet by mouth every 4 (four) hours as needed (for pain).     ibuprofen 200 MG tablet  Commonly known as:  ADVIL,MOTRIN  Take 200 mg by mouth every 6 (six) hours as needed for headache.     lisinopril 20 MG tablet  Commonly known as:  PRINIVIL,ZESTRIL  Take 1 tablet (20 mg total) by mouth daily.     meclizine 25 MG tablet  Commonly known as:  ANTIVERT  Take 1 tablet (25 mg total) by mouth 3  (three) times daily as needed for dizziness or nausea.     metFORMIN 500 MG 24 hr tablet  Commonly known as:  GLUCOPHAGE-XR  Take 500 mg by mouth daily.        LABORATORY STUDIES CBC    Component Value Date/Time   WBC 7.8 10/13/2014 0124   RBC 4.48 10/13/2014 0124   HGB 13.4 10/13/2014 0124   HCT 40.3 10/13/2014 0124   PLT 205 10/13/2014 0124   MCV 90.0 10/13/2014 0124   MCH 29.9 10/13/2014 0124   MCHC 33.3 10/13/2014 0124   RDW 13.8 10/13/2014 0124   CMP    Component Value Date/Time   NA 139 10/17/2014 0856   K 3.6 10/13/2014 0124   CL 105 10/13/2014 0124   CO2 26 10/13/2014 0124   GLUCOSE 179* 10/13/2014 0124   BUN 14 10/13/2014 0124   CREATININE 1.09 10/13/2014 0124   CALCIUM 8.6* 10/13/2014 0124   PROT 8.3* 10/10/2014 1714   ALBUMIN 4.4 10/10/2014 1714   AST 26 10/10/2014 1714   ALT 27 10/10/2014 1714   ALKPHOS 66 10/10/2014 1714   BILITOT 0.5 10/10/2014 1714   GFRNONAA >60 10/13/2014 0124   GFRAA >60 10/13/2014 0124   COAGS Lab Results  Component Value Date   INR 1.02 10/10/2014   Lipid Panel  Component Value Date/Time   CHOL 359* 10/11/2014 0630   TRIG 115 10/11/2014 0630   HDL 55 10/11/2014 0630   CHOLHDL 6.5 10/11/2014 0630   VLDL 23 10/11/2014 0630   LDLCALC 281* 10/11/2014 0630   HgbA1C  Lab Results  Component Value Date   HGBA1C 8.8* 10/11/2014   Cardiac Panel (last 3 results) No results for input(s): CKTOTAL, CKMB, TROPONINI, RELINDX in the last 72 hours. Urinalysis    Component Value Date/Time   COLORURINE YELLOW* 10/10/2014 1714   APPEARANCEUR CLEAR* 10/10/2014 1714   LABSPEC 1.019 10/10/2014 1714   PHURINE 5.0 10/10/2014 1714   GLUCOSEU 50* 10/10/2014 1714   HGBUR NEGATIVE 10/10/2014 1714   BILIRUBINUR NEGATIVE 10/10/2014 1714   KETONESUR 1+* 10/10/2014 1714   PROTEINUR 30* 10/10/2014 1714   NITRITE NEGATIVE 10/10/2014 1714   LEUKOCYTESUR NEGATIVE 10/10/2014 1714   Urine Drug Screen No results found for: LABOPIA,  COCAINSCRNUR, LABBENZ, AMPHETMU, THCU, LABBARB  Alcohol Level No results found for: ETH   SIGNIFICANT DIAGNOSTIC STUDIES Ct Angio Head and neck W/cm &/or Wo Cm 10/11/2014 IMPRESSION: Extensive acute infarct in the superior cerebellum bilaterally with mass-effect and compression of the fourth ventricle. There is early hydrocephalus unchanged from yesterday. Small amount of hemorrhage in the cerebellar infarct best seen by MRI. Bilateral carotid and vertebral arteries widely patent in the neck. No significant intracranial stenosis or mass vascular malformation. 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   Ct Head Wo Contrast 10/10/2014 IMPRESSION: Extensive abnormal attenuation throughout the mid and upper cerebellar hemispheres with relative compression of the fourth ventricle. Early enlargement of the lateral and third ventricles. The appearance most likely is due to acute infarct with venous infarction questioned given the somewhat unusual appearance of these lesions. Would advise brain MRI pre and post-contrast to further evaluate. MR angiography and venography also could be helpful to further assess given this somewhat unusual appearance in the cerebellum. No acute hemorrhage.  10/08/2014 IMPRESSION: Normal head CT.   Mr Angiogram Head Wo Contrast 10/10/2014 IMPRESSION: MRA circle of Willis without evidence for significant proximal stenosis, aneurysm, or branch vessel occlusion   Mr Laqueta Jean Wo Contrast 10/10/2014 IMPRESSION: 1. Extensive patchy areas of acute/subacute infarction involving the posterior circulation. This is most prominent within the superior aspect of the cerebellum bilaterally but also involves the posterior left pons, left thalamus, posterior medial bilateral parietal lobes, and left occipital pole. 2. T2 changes and leptomeningeal enhancement suggest a subacute time frame. 3. There is some hemorrhagic conversion of the superior  cerebellar infarcts with blood products noted. 4. Mild periventricular white matter changes are noted otherwise. 5. Mass effect on the cerebellum with partial effacement of the fourth ventricle and quadrigeminal plate cistern. There is no gross herniation.  Mr Mrv Head Wo Cm 10/10/2014 IMPRESSION: Negative MR venogram   2D Echocardiogram  - Left ventricle: The cavity size was normal. Wall thickness wasincreased in a pattern of mild LVH. Systolic function was normal.The estimated ejection fraction was in the range of 55% to 60%.Wall motion was normal; there were no regional wall motion abnormalities. Left ventricular diastolic function parameterswere normal. Impressions:  No cardiac source of emboli was indentified.  LE venous doppler no DVT  TEE  Mild LVH, otherwise normal echo. No cardiac or aortic source of embolism identified  EKG normal sinus rhythm. For complete results please see formal report.     HISTORY OF PRESENT ILLNESS George Armstrong is an 54 y.o. male who was  noted by his wife to start having trouble with his vision on Thursday evening. He made a comment that he needed to change the lens on his glasses. Had no other complaints that day. On Friday had complaints of dizziness. This was intermittent and by night time went to bed without further complaints. On Saturday complained of a headache that was intermittent. On Sunday the patient had no complaints. On Monday at 0900 became severely dizzy and was having some trouble getting around. He was taken to [facility]. They did a scan that was unremarkable and treated his headache. The patient was very lethargic after his headache treatment and was sent home. He remained lethargic and slept all day on Tuesday. Went to his primary again on Wednesday and was referred back to [facility] for further evaluation. BP elevated on arrival. Unable to determine last known well. tPA was not given due to presence of  hemorrhage.   HOSPITAL COURSE Mr. George Armstrong is a 54 y.o. male with history of HTN and DM admitted for posterior circulation infarcts. Symptoms stable.   Stroke: Large bilateral cerebellar, small pontine, and b/l punctate PCA infarcts, embolic pattern due to unknown source. Mild hemorrhagic transformation, cytotoxic cerebral edema and obstructive hydrocephalus.   MRI Large bilateral cerebellar, small pontine, and b/l punctate PCA infarcts  MRA Negative  MRV negative  CTA head and neck negative  CT repeat showed early hydrocephalus  2D Echo unremarkable  LDL 281  HgbA1c 8.8  LE venous doppler - negative  Hypercoagulable work up negative   TEE Unremarkable   Loop recorder placed  no antithrombotic prior to admission, now on aspirin 325 mg orally every day  Patient counseled to be compliant with his antithrombotic medications  Ongoing aggressive stroke risk factor management  Therapy recommendations: Home health physical therapy & OT  Disposition: home with OP PT and OT (pt has transportation)  Hydrocephalus  CT showed early hydrocephalus  NSG did not think need EVD needed  Cytotoxic Cerebral Edema & Induced Hypernatremia  Treated with 3% saline to decrease cerebral edema  Na 139 this am, normalized  Diabetes  HgbA1c 8.8 goal < 7.0  Uncontrolled  Started lantus 8 units in hospital  DM education  Discussed with Dr. Pearlean BrownieSethi. Will continue Glucophage at dishcarge, follow up with primary Dr. For ongoing glucose control  Hypertension  Home meds: HCTZ and lisinopril, resumed in hospital  Stable  Patient counseled to be compliant with his blood pressure medications  Hyperlipidemia  Home meds: none   LDL 281, goal < 70  Added lipitor 80mg   Continue statin at discharge  Other Stroke Risk Factors  Obesity, Body mass index is 32.35 kg/(m^2). weight loss recommended  Other Active Problems  Hyponatremia, resolved  Low TSH  -can treat as outpatient  Asymptomatic bradycardia    DISCHARGE EXAM Blood pressure 158/102, pulse 73, temperature 98.8 F (37.1 C), temperature source Oral, resp. rate 20, height 5\' 7"  (1.702 m), weight 93.7 kg (206 lb 9.1 oz), SpO2 100 %. General - Well nourished, well developed,  Ophthalmologic - fundi not visualized due to small pupils.  Cardiovascular - Regular rate and rhythm.  Neck - supple, no carotid bruits  Mental Status - Awake alert Level of arousal and orientation to place, year and person were intact,  Language including expression, naming, repetition, comprehension was assessed and found intact,  Cranial Nerves II - XII - II - Visual field intact OU. III, IV, VI - Extraocular movements exam showed normal range . Mild  saccadic dysmetria to the right V - Facial sensation intact bilaterally. VII - Facial movement intact bilaterally. VIII - Hearing & vestibular intact bilaterally. X - Palate elevates symmetrically. XI - Chin turning & shoulder shrug intact bilaterally. XII - Tongue protrusion intact.  Motor Strength - The patient's strength was normal in all extremities and pronator drift was absent. Bulk was normal and fasciculations were absent.  Motor Tone - Muscle tone was assessed at the neck and appendages and was normal.  Reflexes - The patient's reflexes were symmetrical in all extremities and he had no pathological reflexes.  Sensory - Light touch, temperature/pinprick were assessed and were symmetrical.   Coordination - The patient had normal movements in the hands and feet with minimal right finger to nose dysmetria. Tremor was absent.  Gait and Station - not tested due to safety concerns.   Discharge Diet   Diet Carb Modified Fluid consistency:: Thin; Room service appropriate?: Yes liquids  DISCHARGE PLAN  Disposition:  Home with OP PT and OT   aspirin 325 mg orally every day for secondary stroke prevention.  Follow-up [name],  MD in 2 weeks.  Follow-up with Dr. Delia Heady, Stroke Clinic in 2 months.  40 minutes were spent preparing discharge.  [facility] See Amion for Pager information 10/17/2014 6:04 PM   I have personally examined this patient, reviewed notes, independently viewed imaging studies, participated in medical decision making and plan of care. I have made any additions or clarifications directly to the above note. Agree with note above.    Delia Heady, MD Medical Director [facility] Pager: [phone] 10/17/2014 8:41 PM

## 2014-10-17 NOTE — Op Note (Signed)
INDICATIONS: stroke  PROCEDURE:   Informed consent was obtained prior to the procedure. The risks, benefits and alternatives for the procedure were discussed and the patient comprehended these risks.  Risks include, but are not limited to, cough, sore throat, vomiting, nausea, somnolence, esophageal and stomach trauma or perforation, bleeding, low blood pressure, aspiration, pneumonia, infection, trauma to the teeth and death.    After a procedural time-out, the oropharynx was anesthetized with 20% benzocaine spray. The patient was given 2 mg versed and 50 mcg fentanyl for moderate sedation.   The transesophageal probe was inserted in the esophagus and stomach without difficulty and multiple views were obtained.  The patient was kept under observation until the patient left the procedure room.  The patient left the procedure room in stable condition.   Agitated microbubble saline contrast was administered.  COMPLICATIONS:    There were no immediate complications.  FINDINGS:  Mild LVH, otherwise normal echo. No cardiac or aortic source of embolism identified  RECOMMENDATIONS:     Loop recorder implantation.  Time Spent Directly with the Patient:  60 minutes   Aniel Hubble 10/17/2014, 10:02 AM

## 2014-10-17 NOTE — Discharge Instructions (Signed)
Keep incision clean and dry for 3 days.  °

## 2014-10-17 NOTE — Progress Notes (Signed)
Pt discharging at this time with wife alert, verbal taking all personal belongings. No noted distress. IV discontinued, applied dry dressing. IV team discontinued central line, dry dressing noted to right side of neck clean, dry and intact. Discharge instructions provided with verbal understanding aware of follow up appts.

## 2014-10-17 NOTE — Progress Notes (Signed)
Pt returned to unit with dressing to left upper chest. Dressing clean, dry, and intact. No bleeding. Pt denies pain or discomfort. Will continue to monitor.

## 2014-10-17 NOTE — Progress Notes (Signed)
PT Cancellation Note  Patient Details Name: George Armstrong MRN: [id number] DOB: 07/29/1960   Cancelled Treatment:    Reason Eval/Treat Not Completed: Patient at procedure or test/unavailable  Getting Central line removed.   Freida BusmanAllen, [name] 10/17/2014, 4:23 PM

## 2014-10-18 ENCOUNTER — Encounter (HOSPITAL_COMMUNITY): Payer: Self-pay | Admitting: Cardiovascular Disease

## 2014-10-18 LAB — FACTOR 5 LEIDEN

## 2014-10-18 MED FILL — Lidocaine Inj 1% w/ Epinephrine-1:100000: INTRAMUSCULAR | Qty: 20 | Status: AC

## 2014-10-23 ENCOUNTER — Ambulatory Visit: Attending: Neurology | Admitting: Occupational Therapy

## 2014-10-23 DIAGNOSIS — R531 Weakness: Secondary | ICD-10-CM | POA: Insufficient documentation

## 2014-10-23 DIAGNOSIS — R6889 Other general symptoms and signs: Secondary | ICD-10-CM

## 2014-10-23 DIAGNOSIS — R279 Unspecified lack of coordination: Secondary | ICD-10-CM | POA: Diagnosis not present

## 2014-10-23 DIAGNOSIS — I6931 Cognitive deficits following cerebral infarction: Secondary | ICD-10-CM | POA: Insufficient documentation

## 2014-10-23 DIAGNOSIS — I69898 Other sequelae of other cerebrovascular disease: Secondary | ICD-10-CM | POA: Diagnosis not present

## 2014-10-23 DIAGNOSIS — IMO0002 Reserved for concepts with insufficient information to code with codable children: Secondary | ICD-10-CM

## 2014-10-23 DIAGNOSIS — I69319 Unspecified symptoms and signs involving cognitive functions following cerebral infarction: Secondary | ICD-10-CM

## 2014-10-23 DIAGNOSIS — R278 Other lack of coordination: Secondary | ICD-10-CM

## 2014-10-23 NOTE — Therapy (Signed)
[facility] [address] [place], [place], [address] Phone: [phone]   Fax:  [phone]  Occupational Therapy Evaluation  Patient Details  Name: George Armstrong MRN: [id number] Date of Birth: 11/06/1960 Referring Provider:  Micki Riley, MD  Encounter Date: 10/23/2014      OT End of Session - 10/23/14 1153    Visit Number 1   Number of Visits 17   Date for OT Re-Evaluation 12/21/14   Authorization Type [employer]   Authorization - Visit Number 1   Authorization - Number of Visits 10   OT Start Time 1104   OT Stop Time 1145   OT Time Calculation (min) 41 min   Activity Tolerance Patient limited by fatigue   Behavior During Therapy [facility] for tasks assessed/performed      Past Medical History  Diagnosis Date  . Diabetes mellitus without complication   . Hypertension     Past Surgical History  Procedure Laterality Date  . Cholecystectomy    . Ep implantable device N/A 10/17/2014    Procedure: Loop Recorder Insertion;  Surgeon: Duke Salvia, MD;  Location: [facility] INVASIVE CV LAB;  Service: Cardiovascular;  Laterality: N/A;  . Tee without cardioversion N/A 10/17/2014    Procedure: TRANSESOPHAGEAL ECHOCARDIOGRAM (TEE);  Surgeon: Thurmon Fair, MD;  Location: [facility] ENDOSCOPY;  Service: Cardiovascular;  Laterality: N/A;    There were no vitals filed for this visit.  Visit Diagnosis:  Weakness due to cerebrovascular accident  Decreased functional activity tolerance  Decreased coordination  Cognitive deficits following cerebral infarction      Subjective Assessment - 10/23/14 1103    Currently in Pain? No/denies           [facility] OT Assessment - 10/23/14 0001    Assessment   Diagnosis CVA    Onset Date 10/08/14  symtoms started, 10/11/14 hosp at [facility]   Assessment Pt started having visual changes on 10/08/14, he went to [place] and was diagnosed with vertigo. Pt was d/c home and returned to [facility] on Wed  10/10/14, he was transferred to [facility] 10/11/14 and was diagnosed with large bilateral cerebellar, small pontine and PCA infarcts.    Prior Therapy Acute OT/ PT   Precautions   Precautions Other (comment);Fall   Precaution Comments no driving, no working, no heavy lifting   Balance Screen   Has the patient fallen in the past 6 months No   Home  Environment   Family/patient expects to be discharged to: Private residence   Living Arrangements Spouse/significant other   Available Help at Discharge Family   Home Access Stairs   Home Layout Two level   Alternate Level Stairs - Number of Steps flight inside   Bathroom Shower/Tub Tub/Shower unit   [employer] Standard   Prior Function   Level of Independence Independent with household mobility without device;Independent with homemaking with ambulation   Vocation Full time employment   Vocation Requirements [employer], lifting, pushing, pulling   ADL   Eating/Feeding Needs assist with cutting food   Grooming Supervision/safety   Upper Body Bathing Minimal assistance   Lower Body Bathing Minimal assistance  Pt is sitting in tub with supervision   Upper Body Dressing Supervision/safety;Set up   Lower Body Dressing Supervision/safety;Set up   Toilet Tranfer Supervision/safety   ADL comments Pt is limited by decreased activity tolerance and balance   IADL   Light Housekeeping Needs help with all home maintenance tasks   Prior Level of  Function Meal Prep Idependent with following recipe to make a meal  Has not attempted since CVA, must have supervision   Prior Level of Function Financial Management wife performs   Mobility   Mobility Status --  supervision   Mobility Status Comments supervision-modified independent   Written Expression   Dominant Hand Right   Handwriting 90% legible   Written Experience --  Pt demonstrates new difficulties with spelling   Vision - History   Baseline Vision Wears glasses for distance  only   Vision Assessment   Ocular Range of Motion --  grossly WFLS   Comment Pt denies visual changes and tracks to all quadrants   Activity Tolerance   Activity Tolerance Tolerates 10-20 min activity with muiltiple rests  Pt requested to lay down during eval   Cognition   Overall Cognitive Status Impaired/Different from baseline  difficulties with problem solving and unable to multi task   Mini Mental State Exam  spells world backwards correctly   Attention Selective   Memory Impaired   Memory Impairment Decreased short term memory  recalls 1/3 words   Awareness --   Problem Solving Impaired   Executive Function --  s   Behaviors Other (comment)  Fatigues quickly   Sensation   Light Touch Appears Intact   Coordination   Gross Motor Movements are Fluid and Coordinated Yes   Fine Motor Movements are Fluid and Coordinated No   Coordination and Movement Description slower for RUE   9 Hole Peg Test Right;Left   Right 9 Hole Peg Test 43.81   Left 9 Hole Peg Test 37.28   ROM / Strength   AROM / PROM / Strength AROM   AROM   Overall AROM  Within functional limits for tasks performed   Strength   Overall Strength Within functional limits for tasks performed   Overall Strength Comments proximal shoulder strength: 4+/5, biceps 4+/5, triceps 4/5                           OT Short Term Goals - 10/23/14 1210    OT SHORT TERM GOAL #1   Title I with initial HEP   Time 4   Period Weeks   Status New   OT SHORT TERM GOAL #2   Title Pt will demonstrate improved RUE fine motor coordination as evidenced by performing 9 hole peg test in 38 secs or less.   Time 4   Period Weeks   Status New   OT SHORT TERM GOAL #3   Title Pt will demonstrate improved activity tolerance as evidenced by performing functional activity in standing x 15 mins prior to rest break.   Time 4   Period Weeks   Status New   OT SHORT TERM GOAL #4   Title Pt will demonstrate ability to follow  directions for a simple functional or cooking task with min A.   Time 4   Period Weeks   Status New   OT SHORT TERM GOAL #5   Title Pt will demonstrate ability to place/ retrieve 5 lbs on overhead shelf  x 5 reps each UE without drops   Time 4   Period Weeks   Status New   Additional Short Term Goals   Additional Short Term Goals Yes   OT SHORT TERM GOAL #6   Title Pt will demonstrate ability to perform a physical and cognitive task simultaneously with 80% accuracy   Time 4  Period Weeks   Status New           OT Long Term Goals - 10/23/14 1214    OT LONG TERM GOAL #1   Title Pt will perform all basic ADLS modified independently.   Time 8   Period Weeks   Status New   OT LONG TERM GOAL #2   Title Pt will perform functional activity/ home management x 30-45 or greater mins in standing without rest break.   Time 8   Period Weeks   Status New   OT LONG TERM GOAL #3   Title Pt will demonstrate ability to write a short paragraph in a reasonable amount of time with 100% legibility.   Time 8   Period Weeks   Status New   OT LONG TERM GOAL #4   Title Pt will demonstrate ability to follow instructions to complete a cooking task, demonstrating good safety awareness with supervision.   Time 8   Period Weeks   Status New   OT LONG TERM GOAL #5   Title Pt will demonstrate ability to lift and carry 25 lbs x 50 feet x3 reps in prep for work activities.   Time 8   Period Weeks   Status New   Long Term Additional Goals   Additional Long Term Goals Yes   OT LONG TERM GOAL #6   Title Assess grip strength and set goal if  needed   Time 8   Period Weeks   Status New               Plan - 10/23/14 1153    Clinical Impression Statement Pt s/p bilateral cerebellar, small pontine and punctate PCA infarcts admitted to [facility] 10[facility]11/[facility]. Pt presents with decreased strength, coordination and cognitive deficits which impede performance of ADLs/ IADLs.   Pt will benefit from skilled  therapeutic intervention in order to improve on the following deficits (Retired) Abnormal gait;Decreased coordination;Decreased endurance;Decreased safety awareness;Decreased activity tolerance;Decreased balance;Decreased cognition;Decreased mobility;Decreased strength;Impaired perceived functional ability;Impaired UE functional use   Rehab Potential Good   Clinical Impairments Affecting Rehab Potential see above   OT Frequency 2x / week  plus eval   OT Duration 8 weeks   OT Treatment/Interventions Self-care/ADL training;Moist Heat;Fluidtherapy;DME and/or AE instruction;Patient/family education;Balance training;Therapeutic exercises;Ultrasound;Therapeutic exercise;Therapeutic activities;Cognitive remediation/compensation;Passive range of motion;Functional Mobility Training;Neuromuscular education;Cryotherapy;Parrafin;Energy conservation;Manual Therapy;Visual/perceptual remediation/compensation   Plan initiate HEP for RUE coordination, cognitive re-training   Recommended Other Services PT/ ST- pt is scheduled for PT eval, therapist to request ST orders   Consulted and Agree with Plan of Care Patient;Family member/caregiver   Family Member Consulted wife and son          G-Codes - 10/23/14 1225    Functional Assessment Tool Used 9 hole peg test RUE 43.81, LUE 37.28, fatigues after grossly 10 mins activity, supervision-min a basic ADLS, dependent with home management   Functional Limitation Self care   Self Care Current Status [phone]) At least 40 percent but less than 60 percent impaired, limited or restricted   Self Care Goal Status ([phone]) At least 1 percent but less than 20 percent impaired, limited or restricted      Problem List Patient Active Problem List   Diagnosis Date Noted  . Essential hypertension 10/17/2014  . Obstructive hydrocephalus   . Cerebellar infarct 10/11/2014    RINE,KATHRYN 10/23/2014, 12:36 PM Keene BreathKathryn Rine, OTR/L [phone] Phone: [phone] 12:36  PM 06/07/2016[facility] [address] [place], [address] Phone: [phone]   Fax:  [phone]

## 2014-10-26 ENCOUNTER — Ambulatory Visit: Admitting: Rehabilitative and Restorative Service Providers"

## 2014-10-26 DIAGNOSIS — IMO0002 Reserved for concepts with insufficient information to code with codable children: Secondary | ICD-10-CM

## 2014-10-26 DIAGNOSIS — Z7409 Other reduced mobility: Secondary | ICD-10-CM

## 2014-10-26 DIAGNOSIS — R6889 Other general symptoms and signs: Secondary | ICD-10-CM

## 2014-10-26 DIAGNOSIS — R279 Unspecified lack of coordination: Secondary | ICD-10-CM

## 2014-10-26 DIAGNOSIS — R278 Other lack of coordination: Secondary | ICD-10-CM

## 2014-10-26 DIAGNOSIS — I69898 Other sequelae of other cerebrovascular disease: Secondary | ICD-10-CM | POA: Diagnosis not present

## 2014-10-26 NOTE — Therapy (Signed)
[facility] [address] [place], [place], [address] Phone: [phone]   Fax:  [phone]  Physical Therapy Evaluation  Patient Details  Name: George Armstrong MRN: [id number] Date of Birth: 11-11-1960 Referring Provider:  Micki Riley, MD  Encounter Date: 10/26/2014      PT End of Session - 10/26/14 1200    Visit Number 1   Number of Visits 8   Date for PT Re-Evaluation 12/25/14   PT Start Time 0934   PT Stop Time 1015   PT Time Calculation (min) 41 min   Equipment Utilized During Treatment Gait belt   Activity Tolerance Patient tolerated treatment well   Behavior During Therapy [facility] for tasks assessed/performed      Past Medical History  Diagnosis Date  . Diabetes mellitus without complication   . Hypertension     Past Surgical History  Procedure Laterality Date  . Cholecystectomy    . Ep implantable device N/A 10/17/2014    Procedure: Loop Recorder Insertion;  Surgeon: Duke Salvia, MD;  Location: [facility] INVASIVE CV LAB;  Service: Cardiovascular;  Laterality: N/A;  . Tee without cardioversion N/A 10/17/2014    Procedure: TRANSESOPHAGEAL ECHOCARDIOGRAM (TEE);  Surgeon: Thurmon Fair, MD;  Location: [facility] ENDOSCOPY;  Service: Cardiovascular;  Laterality: N/A;    There were no vitals filed for this visit.  Visit Diagnosis:  Weakness due to cerebrovascular accident  Decreased functional activity tolerance  Decreased coordination  Impaired functional mobility, balance, gait, and endurance      Subjective Assessment - 10/26/14 0926    Subjective pt is a 54 y.o. male diagnosed with large bilateral cerebellar, small pontine and PCA infarcts. pt presents to outpatient neuro PT clinic with complaints of right side "feeling strange and off" and diffiuclties with UE tasks. pt does not currently ambulate with AD  and is walking 10-15 minutes with supervison from family.    Patient is accompained by: Family member    Patient Stated Goals pt would like to return to working [employer] which involes lifting, pushing, and pulling; improve balance; walking; and turns with walking    Currently in Pain? No/denies            [facility] PT Assessment - 10/26/14 0943    Assessment   Medical Diagnosis CVA   Onset Date/Surgical Date 10/11/14   Hand Dominance Right   Prior Therapy acute PT therapy in hospital   Precautions   Precautions Fall   Precaution Comments no driving, no working, no heavy lifting   Balance Screen   Has the patient fallen in the past 6 months No   Has the patient had a decrease in activity level because of a fear of falling?  Yes   Is the patient reluctant to leave their home because of a fear of falling?  No   Home Environment   Living Environment Private residence   Living Arrangements Spouse/significant other;Children   Home Access Stairs to enter   Home Layout Multi-level   Prior Function   Level of Independence Independent   Vocation Full time employment   Vocation Requirements [employer]   Cognition   Overall Cognitive Status Within Functional Limits for tasks assessed   Coordination   Gross Motor Movements are Fluid and Coordinated Yes   Finger Nose Finger Test decreased accuracy/speed on right side   Heel Shin Test [facility]   Posture/Postural Control   Posture/Postural Control No significant limitations   Strength   Overall Strength Within  functional limits for tasks performed   Overall Strength Comments hip flexors 4+/5, rest of LEs grossly 5/5   Flexibility   Soft Tissue Assessment /Muscle Length yes   Hamstrings limited flexibility on R   Ambulation/Gait   Ambulation/Gait Yes   Ambulation/Gait Assistance 5: Supervision   Ambulation Distance (Feet) 400 Feet   Assistive device None   Gait Pattern Step-through pattern   Ambulation Surface Indoor;Level   Gait velocity 4.31   Stairs Yes   Stairs Assistance 5: Supervision   Stair Management Technique No rails;Alternating  pattern   Balance   Balance Assessed Yes   High Level Balance   High Level Balance Activities Tandem walking;Other (comment)  heel walking; toe walking;    High Level Balance Comments pt demo unsteadiness and difficulty with high level balanace activities   Standardized Balance Assessment   Standardized Balance Assessment Dynamic Gait Index   Dynamic Gait Index   Level Surface Normal   Change in Gait Speed Normal   Gait with Horizontal Head Turns Mild Impairment   Gait with Vertical Head Turns Mild Impairment   Gait and Pivot Turn Normal   Step Over Obstacle Normal   Step Around Obstacles Normal   Steps Normal   Total Score 22            Vestibular Assessment - 10/26/14 0001    Symptom Behavior   Type of Dizziness "Funny feeling in head"   Occulomotor Exam   Occulomotor Alignment Normal   Smooth Pursuits Intact   Saccades Dysmetria   Comment occasional correction to right with saccades   Vestibulo-Occular Reflex   VOR 1 Head Only (x 1 viewing) 4/10    Positional Testing   Sidelying Test Sidelying Right;Sidelying Left   Sidelying Right   Sidelying Right Symptoms No nystagmus   Sidelying Left   Sidelying Left Symptoms No nystagmus   Positional Sensitivities   Rolling Right Moderate dizziness  pt reports 5/10 symtpoms   Rolling Left Mild dizziness           PT Education - 10/26/14 1158    Education provided Yes   Education Details discussed with patient rolling habituation exercise for dizziness symptoms, instructed to incrementally remove number of pillows under head when in bed   Person(s) Educated Patient;Spouse;Child(ren)   Methods Explanation   Comprehension Verbalized understanding          PT Short Term Goals - 10/26/14 1225    PT SHORT TERM GOAL #1   Title Pt will be independent with HEP to address balance, dizziness, and gait. Target date July 8th.    Time 4   Period Weeks   Status New   PT SHORT TERM GOAL #2   Title Pt will demonstrate  ability to ambulate with vertical and horizontal head turns without stagger or loss of balance (as in DGI). Target date July 8th.    Time 4   Period Weeks   Status New   PT SHORT TERM GOAL #3   Title Assess 6 MWT and establish appropriate goal. Target date July 8th.   Time 4   Period Weeks   Status New   PT SHORT TERM GOAL #4   Title The patient will verbalize understanding of stroke risk factors and warning signs. Target date July 8th.    Time 4   Period Weeks   Status New   PT SHORT TERM GOAL #5   Title The patient will negotiate level and unlevel community surfaces with no  AD x 1200 ft independently. Target date July 8th.    Time 4   Period Weeks   Status New                  Plan - 10/26/14 1212    Clinical Impression Statement Today pt demonstrated deficits with coordination, dynamic balance, as well as some muscular weakness in his LEs. Pt also is demonstrating and reporting symtpoms of dizziness with positional movements and head turns. pt would benefit from skilled PT to address these deficits, as they are impacting his current mobility and hindering his return to work which requires a substantial amount of balance and endurance.    Pt will benefit from skilled therapeutic intervention in order to improve on the following deficits Abnormal gait;Decreased coordination;Impaired flexibility;Decreased strength;Decreased mobility;Decreased balance;Decreased activity tolerance;Dizziness;Decreased endurance;Difficulty walking   Rehab Potential Good   PT Frequency 2x / week   PT Duration 4 weeks   PT Treatment/Interventions ADLs/Self Care Home Management;Functional mobility training;Patient/family education;Passive range of motion;Therapeutic activities;Therapeutic exercise;Balance training;Canalith Repostioning;Electrical Stimulation;Gait training;Stair training;Neuromuscular re-education   PT Next Visit Plan 6 MWT; assess/treat with static balance activites (assess SLS and  tandem); HEP for VOR exercises and rolling habituation exercises   Consulted and Agree with Plan of Care Patient         Problem List Patient Active Problem List   Diagnosis Date Noted  . Essential hypertension 10/17/2014  . Obstructive hydrocephalus   . Cerebellar infarct 10/11/2014  This entire session was performed under direct supervision and direction of a licensed therapist/therapist assistant . I have personally read, edited and approve of the note as written. WEAVER,CHRISTINA, PT  Darden Dates, Student-PT   [place] 10/26/2014, 12:35 PM  Margretta Ditty, PT  [facility] [address] [place], [place], [address] Phone: [phone]   Fax:  [phone]

## 2014-10-29 ENCOUNTER — Ambulatory Visit: Admitting: Physical Therapy

## 2014-10-29 ENCOUNTER — Ambulatory Visit (INDEPENDENT_AMBULATORY_CARE_PROVIDER_SITE_OTHER): Admitting: *Deleted

## 2014-10-29 DIAGNOSIS — R278 Other lack of coordination: Secondary | ICD-10-CM

## 2014-10-29 DIAGNOSIS — R6889 Other general symptoms and signs: Secondary | ICD-10-CM

## 2014-10-29 DIAGNOSIS — IMO0002 Reserved for concepts with insufficient information to code with codable children: Secondary | ICD-10-CM

## 2014-10-29 DIAGNOSIS — I69898 Other sequelae of other cerebrovascular disease: Secondary | ICD-10-CM | POA: Diagnosis not present

## 2014-10-29 DIAGNOSIS — I639 Cerebral infarction, unspecified: Secondary | ICD-10-CM

## 2014-10-29 DIAGNOSIS — R279 Unspecified lack of coordination: Secondary | ICD-10-CM

## 2014-10-29 DIAGNOSIS — Z7409 Other reduced mobility: Secondary | ICD-10-CM

## 2014-10-29 LAB — CUP PACEART INCLINIC DEVICE CHECK
Date Time Interrogation Session: 20160613164655
Zone Setting Detection Interval: 2000 ms
Zone Setting Detection Interval: 3000 ms
Zone Setting Detection Interval: 340 ms

## 2014-10-29 NOTE — Patient Instructions (Signed)
  Gaze Stabilization: Tip Card 1.Target must remain in focus, not blurry, and appear stationary while head is in motion. 2.Perform exercises with small head movements (45 to either side of midline). 3.Increase speed of head motion so long as target is in focus. 4.If you wear eyeglasses, be sure you can see target through lens (therapist will give specific instructions for bifocal / progressive lenses). 5.These exercises may provoke dizziness or nausea. Work through these symptoms. If too dizzy, slow head movement slightly. Rest between each exercise. 6.Exercises demand concentration; avoid distractions. 7.For safety, perform standing exercises close to a counter, wall, corner, or next to someone.  Copyright  VHI. All rights reserved.  Balance: Eyes Closed - Bilateral (Varied Surfaces)   Stand, feet shoulder width, close eyes. Maintain balance ____ seconds. Repeat ____ times per set. Do ____ sets per session. Do ____ sessions per week. Repeat on compliant surface: foam.  Copyright  VHI. All rights reserved.

## 2014-10-29 NOTE — Progress Notes (Signed)
Wound check appointment. Steri-strips removed. Wound without redness or edema. Incision edges approximated, wound well healed. Pt with 0 tachy episodes; 0 brady episodes; 0 asystole. Carelink monthly summary reports & ROV w/ SK in 37mo.

## 2014-10-30 NOTE — Therapy (Signed)
[facility] [address] [place], [place], [address] Phone: [phone]   Fax:  [phone]  Physical Therapy Treatment  Patient Details  Name: George Armstrong MRN: [id number] Date of Birth: 1960-08-09 Referring Provider:  [name], MD  Encounter Date: 10/29/2014      PT End of Session - 10/30/14 0913    Visit Number 2   Number of Visits 8   Date for PT Re-Evaluation 12/25/14   PT Start Time 1017   PT Stop Time 1100   PT Time Calculation (min) 43 min   Equipment Utilized During Treatment Gait belt   Activity Tolerance Patient tolerated treatment well   Behavior During Therapy [facility] for tasks assessed/performed      Past Medical History  Diagnosis Date  . Diabetes mellitus without complication   . Hypertension     Past Surgical History  Procedure Laterality Date  . Cholecystectomy    . Ep implantable device N/A 10/17/2014    Procedure: Loop Recorder Insertion;  Surgeon: Duke Salvia, MD;  Location: [facility] INVASIVE CV LAB;  Service: Cardiovascular;  Laterality: N/A;  . Tee without cardioversion N/A 10/17/2014    Procedure: TRANSESOPHAGEAL ECHOCARDIOGRAM (TEE);  Surgeon: Thurmon Fair, MD;  Location: [facility] ENDOSCOPY;  Service: Cardiovascular;  Laterality: N/A;    There were no vitals filed for this visit.  Visit Diagnosis:  Weakness due to cerebrovascular accident  Decreased functional activity tolerance  Decreased coordination  Cognitive deficits following cerebral infarction  Impaired functional mobility, balance, gait, and endurance      Subjective Assessment - 10/30/14 0909    Subjective pt reports no falls or significant changes since last PT visit. pt states his dizziness symtpoms have completely subsided and he is now only sleeping on top of two pillows. pt is continuing to walk with family members outdoors and within supermarkets.    Patient is accompained by: Family member   Patient Stated  Goals pt would like to return to working [employer] which involes lifting, pushing, and pulling; improve balance; walking; and turns with walking    Currently in Pain? No/denies      Treatment   6 minute walk test performed- pt ambulated 1213 feet with an starting HR of 85 and a ending HR of 98.    Neuromuscular Re-education:  -Corner balance activities on level surface: Feet together   SLS, alternating foot in stance Tandem stance, alternating foot in front Tandem stance with head turns in vertical and horizontal directions -Corner balance activities on compliant surface: Feet together   SLS, alternating foot in stance Tandem stance, alternating foot in front Tandem stance, alternating foot in front with eyes closed Tandem stance with head turns in vertical and horizontal directions Tandem stance with head turns in vertical and horizontal directions with eyes closed  -At parallel bars with SBA/CGA: 4 laps tandem walking along foam beam 2 laps lateral walking along foam beam  SLS on BOSU ball with emphasis on ankle control  Lateral weight shifting on BOSU ball Ball toss on rockerboard  with weighted ball: pt cued to keep board parallel to the floor, maintaining controlled balance  Heel dips off of 6 inch platform with emphasis on controlled lowering of LE. 10 reps  -Coordinated toe taps onto small cones with instructions to pt to not knock over any cones and maintain balance when in SLS. pt alternated "tapping" LE.  -Braided gait for coordination with CGA. 40 feet x 4 reps with cues for pace  and stepping pattern for first two reps.  -5 reps of pivot turns every 10 feet with increased gait velocity. SBA for safety.                           PT Short Term Goals - 10/26/14 1225    PT SHORT TERM GOAL #1   Title Pt will be independent with HEP to address balance, dizziness, and gait. Target date July 8th.    Time 4   Period Weeks   Status New   PT SHORT TERM GOAL  #2   Title Pt will demonstrate ability to ambulate with vertical and horizontal head turns without stagger or loss of balance (as in DGI). Target date July 8th.    Time 4   Period Weeks   Status New   PT SHORT TERM GOAL #3   Title Assess 6 MWT and establish appropriate goal. Target date July 8th.   Time 4   Period Weeks   Status New   PT SHORT TERM GOAL #4   Title The patient will verbalize understanding of stroke risk factors and warning signs. Target date July 8th.    Time 4   Period Weeks   Status New   PT SHORT TERM GOAL #5   Title The patient will negotiate level and unlevel community surfaces with no AD x 1200 ft independently. Target date July 8th.    Time 4   Period Weeks   Status New   Additional Short Term Goals   Additional Short Term Goals Yes   PT SHORT TERM GOAL #6   Title The patient will improve stroke impact scale mobility from 66.7% up to > 80% for subjective improvements in mobility.   Time 4   Period Weeks           PT Long Term Goals - 10/26/14 1250    PT LONG TERM GOAL #1   Title LTGs=STGs               Plan - 10/30/14 0914    Clinical Impression Statement pt tolerated high level balance activites today very well and demo improvements with coordination when performing braided gait and cone tapping activities today. No symptoms of dizziness was provoked today. pt amb 1213 feet during 6 minute walk test with a HR rise from 85 to 98, suggesting some deconditioning.  pt is making excellent progress towards goals.     Pt will benefit from skilled therapeutic intervention in order to improve on the following deficits Abnormal gait;Decreased coordination;Impaired flexibility;Decreased strength;Decreased mobility;Decreased balance;Decreased activity tolerance;Dizziness;Decreased endurance;Difficulty walking   Rehab Potential Good   PT Frequency 2x / week   PT Duration 4 weeks   PT Treatment/Interventions ADLs/Self Care Home Management;Functional  mobility training;Patient/family education;Passive range of motion;Therapeutic activities;Therapeutic exercise;Balance training;Canalith Repostioning;Electrical Stimulation;Gait training;Stair training;Neuromuscular re-education   PT Next Visit Plan incorportate pushing/pulling/lifting activities into treatment to prepare pt to return to work    [employer] and Agree with Plan of Care Patient;Family member/caregiver        Problem List Patient Active Problem List   Diagnosis Date Noted  . Essential hypertension 10/17/2014  . Obstructive hydrocephalus   . Cerebellar infarct 10/11/2014   Darden Dates, Student-PT   Darden Dates 10/30/2014, 10:25 AM  [facility] [address] [place], [place], [address] Phone: [phone]   Fax:  [phone]

## 2014-10-31 ENCOUNTER — Ambulatory Visit: Admitting: Rehabilitative and Restorative Service Providers"

## 2014-10-31 ENCOUNTER — Ambulatory Visit: Admitting: Occupational Therapy

## 2014-10-31 ENCOUNTER — Other Ambulatory Visit: Payer: Self-pay | Admitting: Neurology

## 2014-10-31 DIAGNOSIS — I69898 Other sequelae of other cerebrovascular disease: Secondary | ICD-10-CM | POA: Diagnosis not present

## 2014-10-31 DIAGNOSIS — R6889 Other general symptoms and signs: Secondary | ICD-10-CM

## 2014-10-31 DIAGNOSIS — R413 Other amnesia: Secondary | ICD-10-CM

## 2014-10-31 DIAGNOSIS — R279 Unspecified lack of coordination: Principal | ICD-10-CM

## 2014-10-31 DIAGNOSIS — R278 Other lack of coordination: Secondary | ICD-10-CM

## 2014-10-31 DIAGNOSIS — I69319 Unspecified symptoms and signs involving cognitive functions following cerebral infarction: Secondary | ICD-10-CM

## 2014-10-31 DIAGNOSIS — R269 Unspecified abnormalities of gait and mobility: Secondary | ICD-10-CM

## 2014-10-31 NOTE — Patient Instructions (Signed)
(  Home) PNF: Flexion Diagonal Pattern, Standing   Standing up, reach to the right foot and then over the left shoulder.  Move your head to follow your hands. Repeat to the other side. Repeat __10__ times per set. Do __2__ sets per session.  2 times/day. Copyright  VHI. All rights reserved.   Walking Head Turn   Standing close to a wall, walk _20 feet looking up to the left and down to the right.  Repeat looking up to the right and down to the left. Touch wall if necessary to keep balance. Repeat _5___ times. Do _2___ sessions per day.  http://[email or web address].exer.us/535   Copyright  VHI. All rights reserved.   Trunk and Head Rotation    With feet apart, turn trunk and head at the same time slowly to the right. Repeat to opposite side. Repeat _10___ times per session. Do _2-3___ sessions per day.  Copyright  VHI. All rights reserved.   Walking Program:  Begin walking for exercise for 30 minutes, 1-2 times/day, 5 days/week.   Progress your walking program by adding 5 minutes to your routine each week, as tolerated. Be sure to wear good walking shoes, walk in a safe environment and only progress to your tolerance.

## 2014-10-31 NOTE — Patient Instructions (Signed)
  Coordination Activities  Perform the following activities for 20 minutes 1 times per day with right hand(s).   Rotate ball in fingertips (clockwise and counter-clockwise).  Toss ball between hands.  Toss ball in air and catch with the same hand.  Juggle 2 balls.  Flip cards 1 at a time as fast as you can.  Deal cards with your thumb (Hold deck in hand and push card off top with thumb).  Pick up coins, buttons, marbles, dried beans/pasta of different sizes and place in container.  Pick up coins and place in container or coin bank.  Pick up coins and stack.  Pick up coins one at a time until you get 5-10 in your hand, then move coins from palm to fingertips to stack one at a time.  Practice writing and/or typing.

## 2014-10-31 NOTE — Therapy (Signed)
[facility] [place] [address] [place], [place], [address] Phone: [phone]   Fax:  [phone]  Occupational Therapy Treatment  Patient Details  Name: George Armstrong MRN: [id number] Date of Birth: [birth year] Referring Provider:  [name], MD  Encounter Date: 10/31/2014      OT End of Session - 10/31/14 1016    Visit Number 2   Date for OT Re-Evaluation 12/21/14   Authorization Type [employer]   Authorization - Visit Number 1   Authorization - Number of Visits 10   OT Start Time 0933   OT Stop Time 1014   OT Time Calculation (min) 41 min   Activity Tolerance Patient tolerated treatment well   Behavior During Therapy [facility] for tasks assessed/performed      Past Medical History  Diagnosis Date  . Diabetes mellitus without complication   . Hypertension     Past Surgical History  Procedure Laterality Date  . Cholecystectomy    . Ep implantable device N/A 10/17/2014    Procedure: Loop Recorder Insertion;  Surgeon: Duke Salvia, MD;  Location: [facility] INVASIVE CV LAB;  Service: Cardiovascular;  Laterality: N/A;  . Tee without cardioversion N/A 10/17/2014    Procedure: TRANSESOPHAGEAL ECHOCARDIOGRAM (TEE);  Surgeon: Thurmon Fair, MD;  Location: [facility] ENDOSCOPY;  Service: Cardiovascular;  Laterality: N/A;    Filed Vitals:   10/31/14 0940  BP: 123/83    Visit Diagnosis:  Decreased coordination  Cognitive deficits following cerebral infarction             Treatment: Pt was instructed in HEP for RUE coordination, he returned demonstration min v.c. Self care: MOCHA : 21/30 ( to further assess cognition and increase pt/ wife awareness of deficits.) Pt demonstrates difficulties with orientation, alternating attention and short term memory.                OT Education - 10/31/14 2120    Education provided Yes   Education Details coordiantion HEP, cautioned against using riding mower due to  decreased attention.   Person(s) Educated Patient;Spouse   Methods Explanation;Demonstration;Verbal cues;Handout   Comprehension Verbalized understanding;Returned demonstration;Verbal cues required          OT Short Term Goals - 10/23/14 1210    OT SHORT TERM GOAL #1   Title I with initial HEP   Time 4   Period Weeks   Status New   OT SHORT TERM GOAL #2   Title Pt will demonstrate improved RUE fine motor coordination as evidenced by performing 9 hole peg test in 38 secs or less.   Time 4   Period Weeks   Status New   OT SHORT TERM GOAL #3   Title Pt will demonstrate improved activity tolerance as evidenced by performing functional activity in standing x 15 mins prior to rest break.   Time 4   Period Weeks   Status New   OT SHORT TERM GOAL #4   Title Pt will demonstrate ability to follow directions for a simple functional or cooking task with min A.   Time 4   Period Weeks   Status New   OT SHORT TERM GOAL #5   Title Pt will demonstrate ability to place/ retrieve 5 lbs on overhead shelf  x 5 reps each UE without drops   Time 4   Period Weeks   Status New   Additional Short Term Goals   Additional Short Term Goals Yes   OT SHORT TERM GOAL #6  Title Pt will demonstrate ability to perform a physical and cognitive task simultaneously with 80% accuracy   Time 4   Period Weeks   Status New           OT Long Term Goals - 10/23/14 1214    OT LONG TERM GOAL #1   Title Pt will perform all basic ADLS modified independently.   Time 8   Period Weeks   Status New   OT LONG TERM GOAL #2   Title Pt will perform functional activity/ home management x 30-45 or greater mins in standing without rest break.   Time 8   Period Weeks   Status New   OT LONG TERM GOAL #3   Title Pt will demonstrate ability to write a short paragraph in a reasonable amount of time with 100% legibility.   Time 8   Period Weeks   Status New   OT LONG TERM GOAL #4   Title Pt will demonstrate  ability to follow instructions to complete a cooking task, demonstrating good safety awareness with supervision.   Time 8   Period Weeks   Status New   OT LONG TERM GOAL #5   Title Pt will demonstrate ability to lift and carry 25 lbs x 50 feet x3 reps in prep for work activities.   Time 8   Period Weeks   Status New   Long Term Additional Goals   Additional Long Term Goals Yes   OT LONG TERM GOAL #6   Title Assess grip strength and set goal if  needed   Time 8   Period Weeks   Status New               Plan - 10/31/14 2114    Clinical Impression Statement Pt is progressing towards goals with improved RUE coordination and gains in activity tolerance today. Pt demonstrates cognitive deficits for short term memory and alernating attention.   Pt will benefit from skilled therapeutic intervention in order to improve on the following deficits (Retired) Abnormal gait;Decreased coordination;Decreased endurance;Decreased safety awareness;Decreased activity tolerance;Decreased balance;Decreased cognition;Decreased mobility;Decreased strength;Impaired perceived functional ability;Impaired UE functional use   Clinical Impairments Affecting Rehab Potential see above   OT Frequency 2x / week   OT Duration 8 weeks   Plan strength/ endurance, cognition for memory/ attention, have pt schedule for ST eval(order in Epic)   Recommended Other Services Have pt schedule ST eval   Consulted and Agree with Plan of Care Patient   Family Member Consulted wife and son        Problem List Patient Active Problem List   Diagnosis Date Noted  . Essential hypertension 10/17/2014  . Obstructive hydrocephalus   . Cerebellar infarct 10/11/2014    Lashunta Frieden 10/31/2014, 9:21 PM Keene Breath, OTR/L [phone] Phone: [phone] 9:21 PM 10/31/2014 [facility] [address] [place], [place], [address] Phone: [phone]   Fax:   [phone]

## 2014-10-31 NOTE — Therapy (Signed)
[place] [address] [place], [place], [address] Phone: [phone]   Fax:  [phone]  Physical Therapy Treatment  Patient Details  Name: George Armstrong MRN: [id number] Date of Birth: 06-Mar-1961 Referring Provider:  Dion Body, MD  Encounter Date: 10/31/2014      PT End of Session - 10/31/14 0300    Visit Number 3   Number of Visits 8   Date for PT Re-Evaluation 12/25/14   PT Start Time 1102   PT Stop Time 1140   PT Time Calculation (min) 38 min   Equipment Utilized During Treatment --   Activity Tolerance Patient tolerated treatment well   Behavior During Therapy [facility] for tasks assessed/performed      Past Medical History  Diagnosis Date  . Diabetes mellitus without complication   . Hypertension     Past Surgical History  Procedure Laterality Date  . Cholecystectomy    . Ep implantable device N/A 10/17/2014    Procedure: Loop Recorder Insertion;  Surgeon: Deboraha Sprang, MD;  Location: [place] CV LAB;  Service: Cardiovascular;  Laterality: N/A;  . Tee without cardioversion N/A 10/17/2014    Procedure: TRANSESOPHAGEAL ECHOCARDIOGRAM (TEE);  Surgeon: Sanda Klein, MD;  Location: [facility] ENDOSCOPY;  Service: Cardiovascular;  Laterality: N/A;    There were no vitals filed for this visit.  Visit Diagnosis:  Abnormality of gait  Decreased functional activity tolerance      Subjective Assessment - 10/31/14 1102    Subjective Only dizziness noted when trying to run (joking with spouse) this morning.  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      NEUROMUSCULAR RE-EDUCATION: Diagonal reaching R and L x 10 reps with large movements with 3/10 symptoms of dizziness Trunk rotation reaching R and L quickly x 10 reps with 3/10 symptoms of dizziness  Gait: Unlevel surface negotiation x 1200 ft on grass, hills and parking lot surfaces  independently Gait with horizontal head turns Gait with vertical head turns Gait with diagonal head turns x 50 ft for each direction Gait with tossing ball R<>L for quick turns  SELF CARE/HOME MANAGEMENT: Discussed home walking and progression of exercises recommending patient participate in household tasks of unloading dishwasher, vacuuming to begin to increase activity tolerance       PT Education - 10/31/14 1156    Education provided Yes   Education Details HEP: diagonal head turns, gait with diagonal head turns, trunk rotation   Person(s) Educated Patient;Spouse   Methods Explanation;Demonstration;Handout   Comprehension Returned demonstration;Verbalized understanding          PT Short Term Goals - 10/31/14 1200    PT SHORT TERM GOAL #1   Title Pt will be independent with HEP to address balance, dizziness, and gait. Target date July 8th.    Time 4   Period Weeks   Status On-going   PT SHORT TERM GOAL #2   Title Pt will demonstrate ability to ambulate with vertical and horizontal head turns without stagger or loss of balance (as in DGI). Target date July 8th.    Baseline Met on 10/31/2014   Time 4   Period Weeks   Status Achieved   PT SHORT TERM GOAL #3   Title Assess 6 MWT and establish appropriate goal. Target date July 8th.   Baseline Met on 10/29/14 with WFLs performance on 6 minute walk.   Time 4   Period Weeks  Status Achieved   PT SHORT TERM GOAL #4   Title The patient will verbalize understanding of stroke risk factors and warning signs. Target date July 8th.    Time 4   Period Weeks   Status On-going   PT SHORT TERM GOAL #5   Title The patient will negotiate level and unlevel community surfaces with no AD x 1200 ft independently. Target date July 8th.    Baseline Met on 10/31/2014   Time 4   Period Weeks   Status Achieved   PT SHORT TERM GOAL #6   Title The patient will improve stroke impact scale mobility from 66.7% up to > 80% for subjective  improvements in mobility.   Time 4   Period Weeks   Status On-going           PT Long Term Goals - 10/26/14 1250    PT LONG TERM GOAL #1   Title LTGs=STGs               Plan - 10/31/14 1201    Clinical Impression Statement The patient is progressing well with activity.  PT provided education for progression of home walking program.  He was also provided exercises for habituation of activities that provoke mild sensations of dizziness.  Patient inquires about mowing the yard- from PT standpoint, he is physically able, however OT recommends waiting due to deficits with divided attention tasks.   PT Next Visit Plan incorportate pushing/pulling/lifting activities into treatment to prepare pt to return to work    Consulted and Agree with Plan of Care Patient;Family member/caregiver   Family Member Consulted spouse        Problem List Patient Active Problem List   Diagnosis Date Noted  . Essential hypertension 10/17/2014  . Obstructive hydrocephalus   . 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 [address] [place], [place], [address] Phone: [phone]   Fax:  [phone]

## 2014-11-01 ENCOUNTER — Ambulatory Visit: Admitting: Occupational Therapy

## 2014-11-01 ENCOUNTER — Encounter: Payer: Self-pay | Admitting: Occupational Therapy

## 2014-11-01 DIAGNOSIS — IMO0002 Reserved for concepts with insufficient information to code with codable children: Secondary | ICD-10-CM

## 2014-11-01 DIAGNOSIS — I69898 Other sequelae of other cerebrovascular disease: Secondary | ICD-10-CM | POA: Diagnosis not present

## 2014-11-01 DIAGNOSIS — I69319 Unspecified symptoms and signs involving cognitive functions following cerebral infarction: Secondary | ICD-10-CM

## 2014-11-01 NOTE — Therapy (Signed)
[facility] Health Outpt Rehabilitation [facility] [address] [place], [place], [address] Phone: [phone]   Fax:  [phone]  Occupational Therapy Treatment  Patient Details  Name: George Armstrong MRN: [id number] Date of Birth: 1961/02/23 Referring Provider:  [name], MD  Encounter Date: 11/01/2014      OT End of Session - 11/01/14 1709    Visit Number 3   Number of Visits 17   Date for OT Re-Evaluation 12/21/14   Authorization Type [employer]   OT Start Time 1617   OT Stop Time 1659   OT Time Calculation (min) 42 min   Activity Tolerance Patient tolerated treatment well      Past Medical History  Diagnosis Date  . Diabetes mellitus without complication   . Hypertension     Past Surgical History  Procedure Laterality Date  . Cholecystectomy    . Ep implantable device N/A 10/17/2014    Procedure: Loop Recorder Insertion;  Surgeon: Duke Salvia, MD;  Location: [facility] INVASIVE CV LAB;  Service: Cardiovascular;  Laterality: N/A;  . Tee without cardioversion N/A 10/17/2014    Procedure: TRANSESOPHAGEAL ECHOCARDIOGRAM (TEE);  Surgeon: Thurmon Fair, MD;  Location: [facility] ENDOSCOPY;  Service: Cardiovascular;  Laterality: N/A;    There were no vitals filed for this visit.  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 No/denies                      OT Treatments/Exercises (OP) - 11/01/14 0001    Cognitive Exercises   Other Cognitive Exercises 1 Symbol Digit Modalities test = 23 in 90 seconds, zero errors. Trail Making Test A = 34.99 no errors,  Trail Making B Test= 2.48.59 no errors. Testing shows pt has decreased speed of cognitive processing and some difficulty  with switching set.  Pt required additional time to correctly sequence Trail Making B intially however improved as test progressed.  Pt able to recall 2/3 activities from HEP for coordination. Pt able to recall a third activity with cue of "is there one with a ball" suggesting a retrieval issue with memory vs forming or storing memory.    Exercises   Exercises Hand   Hand Exercises   Theraputty Flatten;Roll;Grip  pt given HEP with green putty   Hand Gripper with Small Beads Grip strength assessed per plan and mild deficit noted - goal added for grip strength and pt issued HEP.  Pt able to pick up 1 inch blocks with 55 pounds of resistance with min dropping.     Other Hand Exercises Pt able to return demonstrate all theraputty exercises for HEP x3 - see pt instruction for details. Pt issued green putty.                OT Education - 11/01/14 1705    Education provided Yes   Education Details theraputty green   Person(s) Educated Patient;Spouse   Methods Explanation;Demonstration;Verbal cues;Handout   Comprehension Verbalized understanding;Returned demonstration          OT Short Term Goals - 11/01/14 1705    OT SHORT TERM GOAL #1   Title I with initial HEP   Time 4   Period Weeks  Status On-going   OT SHORT TERM GOAL #2   Title Pt will demonstrate improved RUE fine motor coordination as evidenced by performing 9 hole peg test in 38 secs or less.   Time 4   Period Weeks   Status On-going   OT SHORT TERM GOAL #3   Title Pt will demonstrate improved activity tolerance as evidenced by performing functional activity in standing x 15 mins prior to rest break.   Time 4   Period Weeks   Status On-going   OT SHORT TERM GOAL #4   Title Pt will demonstrate ability to follow directions for a simple functional or cooking task with min A.   Time 4   Period Weeks   Status On-going   OT SHORT TERM GOAL #5   Title Pt will demonstrate ability to place/ retrieve 5 lbs on overhead  shelf  x 5 reps each UE without drops   Time 4   Period Weeks   Status On-going   OT SHORT TERM GOAL #6   Title Pt will demonstrate ability to perform a physical and cognitive task simultaneously with 80% accuracy   Time 4   Period Weeks   Status On-going           OT Long Term Goals - 11/01/14 1706    OT LONG TERM GOAL #1   Title Pt will perform all basic ADLS modified independently.   Time 8   Period Weeks   Status On-going   OT LONG TERM GOAL #2   Title Pt will perform functional activity/ home management x 30-45 or greater mins in standing without rest break.   Time 8   Period Weeks   Status On-going   OT LONG TERM GOAL #3   Title Pt will demonstrate ability to write a short paragraph in a reasonable amount of time with 100% legibility.   Time 8   Period Weeks   Status On-going   OT LONG TERM GOAL #4   Title Pt will demonstrate ability to follow instructions to complete a cooking task, demonstrating good safety awareness with supervision.   Time 8   Period Weeks   Status On-going   OT LONG TERM GOAL #5   Title Pt will demonstrate ability to lift and carry 25 lbs x 50 feet x3 reps in prep for work activities.   Time 8   Period Weeks   Status On-going   Long Term Additional Goals   Additional Long Term Goals Yes   OT LONG TERM GOAL #6   Title Assess grip strength and set goal if  needed   Time 8   Period Weeks   Status Achieved  baseline R= 80 left = 85 pt right handed   OT LONG TERM GOAL #7   Title Pt will demonstrate increased grip strength by at least 7 pounds in R dominant hand to aid in functional tasks (baseline R= 80, left = 85)   Status New               Plan - 11/01/14 1707    Clinical Impression Statement Pt progressing toward goals;  pt appears to have some improvement in cognition since eval however continues to demonstrate deficits in ST memory, speed of processing and alternating attention   Pt will benefit from skilled therapeutic  intervention in order to improve on the following deficits (Retired) Abnormal gait;Decreased coordination;Decreased endurance;Decreased safety awareness;Decreased activity tolerance;Decreased balance;Decreased cognition;Decreased mobility;Decreased strength;Impaired perceived functional ability;Impaired UE functional use  Rehab Potential Good   Clinical Impairments Affecting Rehab Potential see above   OT Frequency 2x / week   OT Duration 8 weeks   OT Treatment/Interventions Self-care/ADL training;Moist Heat;Fluidtherapy;DME and/or AE instruction;Patient/family education;Balance training;Therapeutic exercises;Ultrasound;Therapeutic exercise;Therapeutic activities;Cognitive remediation/compensation;Passive range of motion;Functional Mobility Training;Neuromuscular education;Cryotherapy;Parrafin;Energy conservation;Manual Therapy;Visual/perceptual remediation/compensation   Plan cogntion for alternating attention, speed of processing , ST memory (retrieval), FMC   Consulted and Agree with Plan of Care Patient;Family member/caregiver   Family Member Consulted wife and son        Problem List Patient Active Problem List   Diagnosis Date Noted  . Essential hypertension 10/17/2014  . Obstructive hydrocephalus   . 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 [place], [place], [address] Phone: [phone]   Fax:  [phone]

## 2014-11-01 NOTE — Patient Instructions (Signed)
Theraputty exercises:  Using green color putty:  Do these 1-2 times per day. STOP if you get pain and let your therapist know.  1. Make a ball     Make a pancake     Make a cone Do this sequence 5 times  2. Make a fat hot dog. Squeeze as hard as you can. Do this 10 times 3. Pull taffy.  Do this 10 times 4. Ring around the fingers.  Do this 10 times.  Pt issued program with pictures.

## 2014-11-05 ENCOUNTER — Encounter: Payer: Self-pay | Admitting: Occupational Therapy

## 2014-11-05 ENCOUNTER — Ambulatory Visit: Admitting: Occupational Therapy

## 2014-11-05 DIAGNOSIS — IMO0002 Reserved for concepts with insufficient information to code with codable children: Secondary | ICD-10-CM

## 2014-11-05 DIAGNOSIS — I69319 Unspecified symptoms and signs involving cognitive functions following cerebral infarction: Secondary | ICD-10-CM

## 2014-11-05 DIAGNOSIS — R279 Unspecified lack of coordination: Secondary | ICD-10-CM

## 2014-11-05 DIAGNOSIS — I69898 Other sequelae of other cerebrovascular disease: Secondary | ICD-10-CM | POA: Diagnosis not present

## 2014-11-05 DIAGNOSIS — R278 Other lack of coordination: Secondary | ICD-10-CM

## 2014-11-05 NOTE — Therapy (Signed)
[facility] Health Outpt Rehabilitation [facility] [address] [place], [place], [address] Phone: [phone]   Fax:  [phone]  Occupational Therapy Treatment  Patient Details  Name: NEKTARIOS PENNINGER MRN: [id number] Date of Birth: [birth year] Referring Provider:  [name], MD  Encounter Date: 11/05/2014      OT End of Session - 11/05/14 1236    Visit Number 4   Number of Visits 17   Date for OT Re-Evaluation 12/21/14   Authorization Type [employer]   Authorization - Visit Number 4   Authorization - Number of Visits 10   OT Start Time 1145   OT Stop Time 1229   OT Time Calculation (min) 44 min   Activity Tolerance Patient tolerated treatment well      Past Medical History  Diagnosis Date  . Diabetes mellitus without complication   . Hypertension     Past Surgical History  Procedure Laterality Date  . Cholecystectomy    . Ep implantable device N/A 10/17/2014    Procedure: Loop Recorder Insertion;  Surgeon: Duke Salvia, MD;  Location: [facility] INVASIVE CV LAB;  Service: Cardiovascular;  Laterality: N/A;  . Tee without cardioversion N/A 10/17/2014    Procedure: TRANSESOPHAGEAL ECHOCARDIOGRAM (TEE);  Surgeon: Thurmon Fair, MD;  Location: [facility] ENDOSCOPY;  Service: Cardiovascular;  Laterality: N/A;    There were no vitals filed for this visit.  Visit Diagnosis:  Weakness due to cerebrovascular accident  Decreased coordination  Cognitive deficits following cerebral infarction      Subjective Assessment - 11/05/14 1150    Subjective  My hand feels stronger   Patient is accompained by: Family member  wife and son   Pertinent History see Epic snapshot   Patient Stated Goals to ride lawnmower   Currently in Pain? No/denies                      OT Treatments/Exercises (OP) - 11/05/14 0001    Exercises   Exercises Hand   Hand Exercises   Theraputty Flatten;Roll;Locate Pegs  20 pegs in blue putty   Hand Gripper with Small  Beads Pt able to stack  1 inch blocks with gripper on 35# with stacks of four with moderate dropping. Pt attempted stacking with 55# of resistance but unable. Pt can pick up blocks with 55# of resistance with rest breaks. Activity completed after 30 minutes of activity with R hand.    Fine Motor Coordination   Fine Motor Coordination In hand manipuation training   In Hand Manipulation Training Grooved peg board with min difficulty. Pt also did tweezer dexterity without tweezers with min dropping. Pt needed cues to hold pins appropriately. Pt able to flip cards in both directions and pick up small objects with increased time and min diffficulty.                  OT Education - 11/05/14 1221    Education provided Yes   Person(s) Educated Patient   Methods Explanation;Handout   Comprehension Verbalized understanding;Returned demonstration          OT Short Term Goals - 11/05/14 1234    OT SHORT TERM GOAL #1   Title I with initial HEP   Time 4   Period Weeks   Status On-going   OT SHORT TERM GOAL #2   Title Pt will demonstrate improved RUE fine motor coordination as evidenced by performing 9 hole peg test in 38 secs or less.   Time 4  Period Weeks   Status On-going   OT SHORT TERM GOAL #3   Title Pt will demonstrate improved activity tolerance as evidenced by performing functional activity in standing x 15 mins prior to rest break.   Time 4   Period Weeks   Status On-going   OT SHORT TERM GOAL #4   Title Pt will demonstrate ability to follow directions for a simple functional or cooking task with min A.   Time 4   Period Weeks   Status On-going   OT SHORT TERM GOAL #5   Title Pt will demonstrate ability to place/ retrieve 5 lbs on overhead shelf  x 5 reps each UE without drops   Time 4   Period Weeks   Status On-going   OT SHORT TERM GOAL #6   Title Pt will demonstrate ability to perform a physical and cognitive task simultaneously with 80% accuracy   Time 4    Period Weeks   Status On-going           OT Long Term Goals - 11/05/14 1234    OT LONG TERM GOAL #1   Title Pt will perform all basic ADLS modified independently.   Time 8   Period Weeks   Status On-going   OT LONG TERM GOAL #2   Title Pt will perform functional activity/ home management x 30-45 or greater mins in standing without rest break.   Time 8   Period Weeks   Status On-going   OT LONG TERM GOAL #3   Title Pt will demonstrate ability to write a short paragraph in a reasonable amount of time with 100% legibility.   Time 8   Period Weeks   Status On-going   OT LONG TERM GOAL #4   Title Pt will demonstrate ability to follow instructions to complete a cooking task, demonstrating good safety awareness with supervision.   Time 8   Period Weeks   Status On-going   OT LONG TERM GOAL #5   Title Pt will demonstrate ability to lift and carry 25 lbs x 50 feet x3 reps in prep for work activities.   Time 8   Period Weeks   Status On-going   OT LONG TERM GOAL #6   Title Assess grip strength and set goal if  needed   Time 8   Period Weeks   Status Achieved  baseline R= 80 left = 85 pt right handed   OT LONG TERM GOAL #7   Title Pt will demonstrate increased grip strength by at least 7 pounds in R dominant hand to aid in functional tasks (baseline R= 80, left = 85)   Status New               Plan - 11/05/14 1234    Clinical Impression Statement Pt making progress toward LTG's. Pt reports he did his theraputty exercises inconsistently over the weekend - encouraged pt to complete HEP consistently.   Pt will benefit from skilled therapeutic intervention in order to improve on the following deficits (Retired) Abnormal gait;Decreased coordination;Decreased endurance;Decreased safety awareness;Decreased activity tolerance;Decreased balance;Decreased cognition;Decreased mobility;Decreased strength;Impaired perceived functional ability;Impaired UE functional use   Rehab  Potential Good   Clinical Impairments Affecting Rehab Potential see above   OT Frequency 2x / week   OT Duration 8 weeks   OT Treatment/Interventions Self-care/ADL training;Moist Heat;Fluidtherapy;DME and/or AE instruction;Patient/family education;Balance training;Therapeutic exercises;Ultrasound;Therapeutic exercise;Therapeutic activities;Cognitive remediation/compensation;Passive range of motion;Functional Mobility Training;Neuromuscular education;Cryotherapy;Parrafin;Energy conservation;Manual Therapy;Visual/perceptual remediation/compensation   Plan cooking task, functional  use of R hand   Consulted and Agree with Plan of Care Patient;Family member/caregiver   Family Member Consulted wife and son        Problem List Patient Active Problem List   Diagnosis Date Noted  . Essential hypertension 10/17/2014  . Obstructive hydrocephalus   . 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

## 2014-11-05 NOTE — Patient Instructions (Signed)
Pt issued R handed typing words to incorporate into [facility] home program.

## 2014-11-07 ENCOUNTER — Ambulatory Visit: Admitting: Physical Therapy

## 2014-11-08 ENCOUNTER — Ambulatory Visit: Admitting: Physical Therapy

## 2014-11-08 ENCOUNTER — Encounter: Payer: Self-pay | Admitting: Occupational Therapy

## 2014-11-08 ENCOUNTER — Ambulatory Visit

## 2014-11-08 ENCOUNTER — Ambulatory Visit: Admitting: Occupational Therapy

## 2014-11-08 DIAGNOSIS — R279 Unspecified lack of coordination: Secondary | ICD-10-CM

## 2014-11-08 DIAGNOSIS — G8191 Hemiplegia, unspecified affecting right dominant side: Secondary | ICD-10-CM

## 2014-11-08 DIAGNOSIS — I69898 Other sequelae of other cerebrovascular disease: Secondary | ICD-10-CM | POA: Diagnosis not present

## 2014-11-08 DIAGNOSIS — R41841 Cognitive communication deficit: Secondary | ICD-10-CM

## 2014-11-08 DIAGNOSIS — R4701 Aphasia: Secondary | ICD-10-CM

## 2014-11-08 DIAGNOSIS — R6889 Other general symptoms and signs: Secondary | ICD-10-CM

## 2014-11-08 DIAGNOSIS — R278 Other lack of coordination: Secondary | ICD-10-CM

## 2014-11-08 DIAGNOSIS — I69319 Unspecified symptoms and signs involving cognitive functions following cerebral infarction: Secondary | ICD-10-CM

## 2014-11-08 NOTE — Therapy (Signed)
[facility] [address] [place], [place], [address] Phone: [phone]   Fax:  [phone]  Occupational Therapy Treatment  Patient Details  Name: George Armstrong MRN: [id number] Date of Birth: May 19, 1960 Referring Provider:  Dion Body, MD  Encounter Date: 11/08/2014      OT End of Session - 11/08/14 1515    Visit Number 5   Number of Visits 17   Date for OT Re-Evaluation 12/21/14   Authorization Type [employer]   OT Start Time 1315   OT Stop Time 1358   OT Time Calculation (min) 43 min      Past Medical History  Diagnosis Date  . Diabetes mellitus without complication   . Hypertension     Past Surgical History  Procedure Laterality Date  . Cholecystectomy    . Ep implantable device N/A 10/17/2014    Procedure: Loop Recorder Insertion;  Surgeon: Deboraha Sprang, MD;  Location: [place] CV LAB;  Service: Cardiovascular;  Laterality: N/A;  . Tee without cardioversion N/A 10/17/2014    Procedure: TRANSESOPHAGEAL ECHOCARDIOGRAM (TEE);  Surgeon: Sanda Klein, MD;  Location: [facility] ENDOSCOPY;  Service: Cardiovascular;  Laterality: N/A;    There were no vitals filed for this visit.  Visit Diagnosis:  Cognitive deficits following cerebral infarction  Decreased coordination  Decreased functional activity tolerance  Hemiplegia affecting right dominant side      Subjective Assessment - 11/08/14 1322    Subjective  My hand feels stronger   Patient is accompained by: Family member  wife and son   Pertinent History see Epic snapshot   Patient Stated Goals to ride lawnmower   Currently in Pain? No/denies                      OT Treatments/Exercises (OP) - 11/08/14 0001    ADLs   Writing Pt with 50% legibility with paragraph length without AE. Legibility improved with small gripper. Pt issued gripper and writing incorporated into HEP.  While pt has good burst grip strength pt does not have  adequate sustained strength for many functional actitivies. Pt reported 8/10 fatigue in hand with 4 sentence paragraph.   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  Pt stated he could not feel a difference in carrying between right and left UE's   Fine Motor Coordination   In Hand Manipulation Training Pt with significant improvement in 9 hole peg time of 25.53.  Pt reports he still has some difficulty with some functional tasks such as shaking the salt shaker or using a whisk.                    OT Short Term Goals - 11/08/14 1338    OT SHORT TERM GOAL #1   Title I with initial HEP   Time 4   Period Weeks   Status On-going   OT SHORT TERM GOAL #2   Title Pt will demonstrate improved RUE fine motor coordination as evidenced by performing 9 hole peg test in 38 secs or less.   Time 4   Period Weeks   Status Achieved  25.53 on 11/08/2014   OT SHORT TERM GOAL #3   Title Pt will demonstrate improved  activity tolerance as evidenced by performing functional activity in standing x 15 mins prior to rest break.   Time 4   Period Weeks   Status On-going   OT SHORT TERM GOAL #4   Title Pt will demonstrate ability to follow directions for a simple functional or cooking task with min A.   Time 4   Period Weeks   Status On-going   OT SHORT TERM GOAL #5   Title Pt will demonstrate ability to place/ retrieve 5 lbs on overhead shelf  x 5 reps each UE without drops   Time 4   Period Weeks   Status Achieved   OT SHORT TERM GOAL #6   Title Pt will demonstrate ability to perform a physical and cognitive task simultaneously with 80% accuracy   Time 4   Period Weeks   Status On-going           OT Long Term Goals - 11/08/14 1339    OT LONG TERM GOAL #1   Title Pt  will perform all basic ADLS modified independently.   Time 8   Period Weeks   Status On-going   OT LONG TERM GOAL #2   Title Pt will perform functional activity/ home management x 30-45 or greater mins in standing without rest break.   Time 8   Period Weeks   Status On-going   OT LONG TERM GOAL #3   Title Pt will demonstrate ability to write a short paragraph in a reasonable amount of time with 100% legibility.   Time 8   Period Weeks   Status On-going   OT LONG TERM GOAL #4   Title Pt will demonstrate ability to follow instructions to complete a cooking task, demonstrating good safety awareness with supervision.   Time 8   Period Weeks   Status On-going   OT LONG TERM GOAL #5   Title Pt will demonstrate ability to lift and carry 25 lbs x 50 feet x3 reps in prep for work activities.   Time 8   Period Weeks   Status On-going   OT LONG TERM GOAL #6   Title Assess grip strength and set goal if  needed   Time 8   Period Weeks   Status Achieved  baseline R= 80 left = 85 pt right handed   OT LONG TERM GOAL #7   Title Pt will demonstrate increased grip strength by at least 20 pounds in R dominant hand to aid in functional tasks (baseline R= 80, left = 85)   Status Revised  met intiial goal 95 pounds on 11/08/2014 however pt reports he feels it is still a little weak compared to prior to stroke               Plan - 11/08/14 1514    Clinical Impression Statement Pt making good progress toward goals and is motivated to maximize improvement . Wife very involved and supportive.   Pt will benefit from skilled therapeutic intervention in order to improve on the following deficits (Retired) Abnormal gait;Decreased coordination;Decreased endurance;Decreased safety awareness;Decreased activity tolerance;Decreased balance;Decreased cognition;Decreased mobility;Decreased strength;Impaired perceived functional ability;Impaired UE functional use   Rehab Potential Good   Clinical Impairments  Affecting Rehab Potential see above   OT Frequency 2x / week   OT Duration 8 weeks   OT Treatment/Interventions Self-care/ADL training;Moist Heat;Fluidtherapy;DME and/or AE instruction;Patient/family education;Balance training;Therapeutic exercises;Ultrasound;Therapeutic exercise;Therapeutic activities;Cognitive remediation/compensation;Passive range of motion;Functional Mobility Training;Neuromuscular education;Cryotherapy;Parrafin;Energy conservation;Manual Therapy;Visual/perceptual remediation/compensation   Plan activity tolerance, multi tasking,  cooking task        Problem List Patient Active Problem List   Diagnosis Date Noted  . Essential hypertension 10/17/2014  . Obstructive hydrocephalus   . Cerebellar infarct 10/11/2014    Quay Burow, OTR/L 11/08/2014, 3:17 PM  [facility] [address] [place], [place], [address] Phone: [phone]   Fax:  [phone]

## 2014-11-08 NOTE — Therapy (Signed)
[facility] [address] [place], [place], [address] Phone: [phone]   Fax:  [phone]  Speech Language Pathology Evaluation  Patient Details  Name: George Armstrong MRN: [id number] Date of Birth: [birth year] Referring Provider:  [name], MD  Encounter Date: 11/08/2014      End of Session - 11/08/14 1439    Visit Number 1   Number of Visits 16   Date for SLP Re-Evaluation 01/08/15   SLP Start Time 1149   SLP Stop Time  1233   SLP Time Calculation (min) 44 min   Activity Tolerance Patient tolerated treatment well      Past Medical History  Diagnosis Date  . Diabetes mellitus without complication   . Hypertension     Past Surgical History  Procedure Laterality Date  . Cholecystectomy    . Ep implantable device Armstrong/A 10/17/2014    Procedure: Loop Recorder Insertion;  Surgeon: Duke Salvia, MD;  Location: [facility] INVASIVE CV LAB;  Service: Cardiovascular;  Laterality: Armstrong/A;  . Tee without cardioversion Armstrong/A 10/17/2014    Procedure: TRANSESOPHAGEAL ECHOCARDIOGRAM (TEE);  Surgeon: Thurmon Fair, MD;  Location: [facility] ENDOSCOPY;  Service: Cardiovascular;  Laterality: Armstrong/A;    There were no vitals filed for this visit.  Visit Diagnosis: Aphasia  Cognitive communication deficit      Subjective Assessment - 11/08/14 1210    Subjective "I can't think of what I want to say sometimes."   Patient is accompained by: Family member  wife, son            SLP Evaluation [facility] - 11/08/14 1210    SLP Visit Information   SLP Received On 11/08/14   Onset Date 10-10-14   Medical Diagnosis CVA   Pain Assessment   Currently in Pain? No/denies   General Information   Other Pertinent Information --   Prior Functional Status   Cognitive/Linguistic Baseline Within functional limits   Cognition   Overall Cognitive Status Impaired/Different from baseline  being addressed in OT   Auditory Comprehension   Overall  Auditory Comprehension Appears within functional limits for tasks assessed   Verbal Expression   Overall Verbal Expression Impaired   Level of Generative/Spontaneous Verbalization Conversation   Effective Techniques Semantic cues;Articulatory cues   Oral Motor/Sensory Function   Overall Oral Motor/Sensory Function Appears within functional limits for tasks assessed   Labial ROM Within Functional Limits   Lingual ROM Within Functional Limits   Velum Within Functional Limits   Standardized Assessments   Standardized Assessments  [employer] Diagnostic Apashia Examiniation-3rd   [employer] Diagnostic Apashia Examiniation-3rd edition  48/60 (WNL >/= 51/60)              SLP Education - 11/08/14 1439    Education provided Yes   Education Details anomia, compensations for anomia   Person(s) Educated Patient;Spouse;Child(ren)   Methods Explanation;Verbal cues   Comprehension Verbalized understanding          SLP Short Term Goals - 11/08/14 1443    SLP SHORT TERM GOAL #1   Title pt will name 8 items in non-concrete category, average   Time 4   Period Weeks   Status New   SLP SHORT TERM GOAL #2   Title pt will demo >15 minutes of mod complex conversation using compensatory strategies in 90% of necessary opportunities   Time 4   Period Weeks   Status New          SLP Long Term Goals -  Dec 07, 2014 1446    SLP LONG TERM GOAL #1   Title pt will demo >20 minutes of mod complex conversation with use of compensations at 95% success, over three sessions   Time 8   Period Weeks   Status New          Plan - 12-07-14 1440    Clinical Impression Statement Pt presents with anomia as identified by [employer] - 2nd Ed. HE reports difficulty internittently in conversation and has to wait for the correct word to come to him. Skilled ST is necessary to teach pt full pallate of compensations and to improve word finding skills in converation.   Speech Therapy Frequency 2x / week    Duration --  8 weels   Treatment/Interventions Patient/family education;Compensatory strategies;Internal/external aids;SLP instruction and feedback;Functional tasks;Cueing hierarchy   Potential to Achieve Goals Good          G-Codes - 2014-12-07 1447    Functional Limitations Spoken language expressive   Spoken Language Expression Current Status [phone]) At least 1 percent but less than 20 percent impaired, limited or restricted   Spoken Language Expression Goal Status (U0454) At least 1 percent but less than 20 percent impaired, limited or restricted      Problem List Patient Active Problem List   Diagnosis Date Noted  . Essential hypertension 10/17/2014  . Obstructive hydrocephalus   . 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 [address] [place], [place], [address] Phone: [phone]   Fax:  [phone]

## 2014-11-08 NOTE — Patient Instructions (Signed)
  Please complete the assigned speech therapy homework and return it to your next session; work for 15-20 minutes, at least two times per day.

## 2014-11-09 ENCOUNTER — Ambulatory Visit: Admitting: Physical Therapy

## 2014-11-09 ENCOUNTER — Encounter: Payer: Self-pay | Admitting: Internal Medicine

## 2014-11-12 ENCOUNTER — Encounter: Payer: Self-pay | Admitting: Internal Medicine

## 2014-11-13 ENCOUNTER — Ambulatory Visit: Admitting: Occupational Therapy

## 2014-11-13 ENCOUNTER — Ambulatory Visit: Admitting: Physical Therapy

## 2014-11-14 ENCOUNTER — Ambulatory Visit: Admitting: Physical Therapy

## 2014-11-14 ENCOUNTER — Ambulatory Visit: Admitting: Occupational Therapy

## 2014-11-14 DIAGNOSIS — R278 Other lack of coordination: Secondary | ICD-10-CM

## 2014-11-14 DIAGNOSIS — R279 Unspecified lack of coordination: Secondary | ICD-10-CM

## 2014-11-14 DIAGNOSIS — I69898 Other sequelae of other cerebrovascular disease: Secondary | ICD-10-CM | POA: Diagnosis not present

## 2014-11-14 DIAGNOSIS — R6889 Other general symptoms and signs: Secondary | ICD-10-CM

## 2014-11-14 DIAGNOSIS — I69319 Unspecified symptoms and signs involving cognitive functions following cerebral infarction: Secondary | ICD-10-CM

## 2014-11-14 DIAGNOSIS — R269 Unspecified abnormalities of gait and mobility: Secondary | ICD-10-CM

## 2014-11-14 DIAGNOSIS — IMO0002 Reserved for concepts with insufficient information to code with codable children: Secondary | ICD-10-CM

## 2014-11-14 NOTE — Therapy (Signed)
[facility] [address] [place], [place], [address] Phone: [phone]   Fax:  [phone]  Physical Therapy Treatment  Patient Details  Name: George Armstrong MRN: [id number] Date of Birth: 1960/08/20 Referring Provider:  Dion Body, MD  Encounter Date: 11/14/2014      PT End of Session - 11/14/14 1238    Visit Number 4   Number of Visits 8   Date for PT Re-Evaluation 12/25/14   PT Start Time 1015   PT Stop Time 1055   PT Time Calculation (min) 40 min   Activity Tolerance Patient tolerated treatment well   Behavior During Therapy [facility] for tasks assessed/performed      Past Medical History  Diagnosis Date  . Diabetes mellitus without complication   . Hypertension     Past Surgical History  Procedure Laterality Date  . Cholecystectomy    . Ep implantable device N/A 10/17/2014    Procedure: Loop Recorder Insertion;  Surgeon: Deboraha Sprang, MD;  Location: [employer] CV LAB;  Service: Cardiovascular;  Laterality: N/A;  . Tee without cardioversion N/A 10/17/2014    Procedure: TRANSESOPHAGEAL ECHOCARDIOGRAM (TEE);  Surgeon: Sanda Klein, MD;  Location: [facility] ENDOSCOPY;  Service: Cardiovascular;  Laterality: N/A;    There were no vitals filed for this visit.  Visit Diagnosis:  Abnormality of gait  Decreased coordination      Subjective Assessment - 11/14/14 1050    Subjective Pt reports no longer experiencing dizziness/disequilibrium. Pt/wife report pt has been performing walking program indoors (for example during grocery shopping) due to recent increase in termperature/humidity. Pt reports that Dr. Leonie Man recommending pt wait until at least August to attempt to go back to work.   Patient is accompained by: Family member   Patient Stated Goals pt would like to return to working [employer] which involes lifting, pushing, and pulling; improve balance; walking; and turns with walking    Currently in Pain?  No/denies          Treatment   Neuro Re-ed: Pt performed the following home exercises with mod I using paper handout: - Standing, BUE PNF chop/lift pattern x10 reps - Gait with diagonal head turns x2 x20' with no overt LOB - Head/trunk rotation 2 x10 reps - Standing in corner n compliant surface (pillow) without UE support with eyes closed x20 seconds without LOB. Pt reported no dizziness/disequilibrium with HEP performance.  Self Care - See Patient Education for details.                       PT Education - 11/14/14 1053    Education provided Yes   Education Details Interactive discussion of CVA risk factors (emphasis on modifiable risk factors) and warning signs. PT goals, progress, and D/C plan.    Person(s) Educated Patient;Spouse;Child(ren)   Methods Explanation   Comprehension Verbalized understanding          PT Short Term Goals - 11/14/14 1017    PT SHORT TERM GOAL #1   Title Pt will be independent with HEP to address balance, dizziness, and gait. Target date July 8th.    Time 4   Period Weeks   Status Achieved   PT SHORT TERM GOAL #2   Title Pt will demonstrate ability to ambulate with vertical and horizontal head turns without stagger or loss of balance (as in DGI). Target date July 8th.    Baseline Met on 10/31/2014   Time 4   Period  Weeks   Status Achieved   PT SHORT TERM GOAL #3   Title Assess 6 MWT and establish appropriate goal. Target date July 8th.   Baseline Met on 10/29/14 with WFLs performance on 6 minute walk.   Time 4   Period Weeks   Status Achieved   PT SHORT TERM GOAL #4   Title The patient will verbalize understanding of stroke risk factors and warning signs. Target date July 8th.    Time 4   Period Weeks   Status Achieved   PT SHORT TERM GOAL #5   Title The patient will negotiate level and unlevel community surfaces with no AD x 1200 ft independently. Target date July 8th.    Baseline Met on 10/31/2014   Time 4    Period Weeks   Status Achieved   PT SHORT TERM GOAL #6   Title The patient will improve stroke impact scale mobility from 66.7% up to > 80% for subjective improvements in mobility.   Baseline Scored 100% on Mobility section of SIS on 11/14/14.   Time 4   Period Weeks   Status Achieved           PT Long Term Goals - 10/26/14 1250    PT LONG TERM GOAL #1   Title LTGs=STGs               Plan - 11/14/14 1251    Clinical Impression Statement Patient has met all goals and notes no dizziness during mobility, functional head turning, HEP performance. Pt will therefore be discharged from outpatient PT at this time. Pt/wife verbalized understanding and were in full agreement with D/C plan.   PT Next Visit Plan No further sessions; pt discharging from PT at this time.   Consulted and Agree with Plan of Care Patient;Family member/caregiver   Family Member Consulted spouse        Problem List Patient Active Problem List   Diagnosis Date Noted  . Essential hypertension 10/17/2014  . Obstructive hydrocephalus   . Cerebellar infarct 10/11/2014    Billie Ruddy, PT, DPT [facility] [address] [place], [place], [address] Phone: [phone]   Fax:  [phone] 11/14/2014, 12:53 PM

## 2014-11-14 NOTE — Therapy (Signed)
[facility] [address] [place], [place], [address] Phone: [phone]   Fax:  [phone]  Occupational Therapy Treatment  Patient Details  Name: George Armstrong MRN: [id number] Date of Birth: Feb 04, 1961 Referring Provider:  Dion Body, MD  Encounter Date: 11/14/2014      OT End of Session - 11/14/14 1007    Visit Number 6   Number of Visits 17   Date for OT Re-Evaluation 12/21/14   Authorization - Visit Number 6   Authorization - Number of Visits 10   OT Start Time 0940   OT Stop Time 6962   OT Time Calculation (min) 35 min   Activity Tolerance --   Behavior During Therapy [facility] for tasks assessed/performed      Past Medical History  Diagnosis Date  . Diabetes mellitus without complication   . Hypertension     Past Surgical History  Procedure Laterality Date  . Cholecystectomy    . Ep implantable device N/A 10/17/2014    Procedure: Loop Recorder Insertion;  Surgeon: Deboraha Sprang, MD;  Location: [place] CV LAB;  Service: Cardiovascular;  Laterality: N/A;  . Tee without cardioversion N/A 10/17/2014    Procedure: TRANSESOPHAGEAL ECHOCARDIOGRAM (TEE);  Surgeon: Sanda Klein, MD;  Location: [facility] ENDOSCOPY;  Service: Cardiovascular;  Laterality: N/A;    There were no vitals filed for this visit.  Visit Diagnosis:  Cognitive deficits following cerebral infarction  Decreased coordination  Decreased functional activity tolerance  Weakness due to cerebrovascular accident      Subjective Assessment - 11/14/14 1106    Subjective  Pt reports he is going to try the riding mower   Patient is accompained by: Family member  wife/ son   Patient Stated Goals to ride lawnmower   Currently in Pain? No/denies      Treatment: divided/ alternating attention for physical and cognitive task simultaneously, (category generation, with mod v.c.)  Functional overhead reaching in a busy environment to copy  small peg design with RUE, with 2 lbs weight added to wrist half way through task for increased resistance, min v.c.for in hand manipulation. (UBE prior to session x 8 mins for conditioning.)                       OT Education - 11/14/14 1107    Education provided Yes   Education Details Recommendation for use of phone alarm for reminding pt of medications   Person(s) Educated Patient;Spouse;Child(ren)   Methods Explanation   Comprehension Verbalized understanding          OT Short Term Goals - 11/08/14 1338    OT SHORT TERM GOAL #1   Title I with initial HEP   Time 4   Period Weeks   Status On-going   OT SHORT TERM GOAL #2   Title Pt will demonstrate improved RUE fine motor coordination as evidenced by performing 9 hole peg test in 38 secs or less.   Time 4   Period Weeks   Status Achieved  25.53 on 11/08/2014   OT SHORT TERM GOAL #3   Title Pt will demonstrate improved activity tolerance as evidenced by performing functional activity in standing x 15 mins prior to rest break.   Time 4   Period Weeks   Status On-going   OT SHORT TERM GOAL #4   Title Pt will demonstrate ability to follow directions for a simple functional or cooking task with min A.   Time  4   Period Weeks   Status On-going   OT SHORT TERM GOAL #5   Title Pt will demonstrate ability to place/ retrieve 5 lbs on overhead shelf  x 5 reps each UE without drops   Time 4   Period Weeks   Status Achieved   OT SHORT TERM GOAL #6   Title Pt will demonstrate ability to perform a physical and cognitive task simultaneously with 80% accuracy   Time 4   Period Weeks   Status On-going           OT Long Term Goals - 11/08/14 1339    OT LONG TERM GOAL #1   Title Pt will perform all basic ADLS modified independently.   Time 8   Period Weeks   Status On-going   OT LONG TERM GOAL #2   Title Pt will perform functional activity/ home management x 30-45 or greater mins in standing without rest  break.   Time 8   Period Weeks   Status On-going   OT LONG TERM GOAL #3   Title Pt will demonstrate ability to write a short paragraph in a reasonable amount of time with 100% legibility.   Time 8   Period Weeks   Status On-going   OT LONG TERM GOAL #4   Title Pt will demonstrate ability to follow instructions to complete a cooking task, demonstrating good safety awareness with supervision.   Time 8   Period Weeks   Status On-going   OT LONG TERM GOAL #5   Title Pt will demonstrate ability to lift and carry 25 lbs x 50 feet x3 reps in prep for work activities.   Time 8   Period Weeks   Status On-going   OT LONG TERM GOAL #6   Title Assess grip strength and set goal if  needed   Time 8   Period Weeks   Status Achieved  baseline R= 80 left = 85 pt right handed   OT LONG TERM GOAL #7   Title Pt will demonstrate increased grip strength by at least 20 pounds in R dominant hand to aid in functional tasks (baseline R= 80, left = 85)   Status Revised  met intiial goal 95 pounds on 11/08/2014 however pt reports he feels it is still a little weak compared to prior to stroke               Plan - 11/14/14 1108    Clinical Impression Statement Pt is making excellent overall progress with gains in endurance, coordination and cognition.   Pt will benefit from skilled therapeutic intervention in order to improve on the following deficits (Retired) Abnormal gait;Decreased coordination;Decreased endurance;Decreased safety awareness;Decreased activity tolerance;Decreased balance;Decreased cognition;Decreased mobility;Decreased strength;Impaired perceived functional ability;Impaired UE functional use   Rehab Potential Good   Clinical Impairments Affecting Rehab Potential see above   OT Frequency 2x / week   OT Duration 8 weeks   OT Treatment/Interventions Moist Heat;Fluidtherapy;DME and/or AE instruction;Patient/family education;Balance training;Therapeutic exercises;Ultrasound;Therapeutic  exercise;Therapeutic activities;Cognitive remediation/compensation;Passive range of motion;Functional Mobility Training;Neuromuscular education;Cryotherapy;Parrafin;Energy conservation;Manual Therapy;Visual/perceptual remediation/compensation;Self-care/ADL training   Plan ccoking task, cognition(alternating/ divided attention, memory)   Recommended Other Services ST eval was performed   Consulted and Agree with Plan of Care Patient;Family member/caregiver   Family Member Consulted wife and son        Problem List Patient Active Problem List   Diagnosis Date Noted  . Essential hypertension 10/17/2014  . Obstructive hydrocephalus   . Cerebellar infarct 10/11/2014  [facility] [place] 11/14/2014, 11:19 AM Theone Murdoch, OTR/L [phone] Phone: [phone] 11:19 AM 11/14/2014 [place] [facility] [address] [place], [place], [address] Phone: [phone]   Fax:  [phone]

## 2014-11-15 ENCOUNTER — Ambulatory Visit: Admitting: Occupational Therapy

## 2014-11-15 ENCOUNTER — Ambulatory Visit

## 2014-11-15 ENCOUNTER — Encounter: Payer: Self-pay | Admitting: Occupational Therapy

## 2014-11-15 DIAGNOSIS — R6889 Other general symptoms and signs: Secondary | ICD-10-CM

## 2014-11-15 DIAGNOSIS — G8191 Hemiplegia, unspecified affecting right dominant side: Secondary | ICD-10-CM

## 2014-11-15 DIAGNOSIS — R4701 Aphasia: Secondary | ICD-10-CM

## 2014-11-15 DIAGNOSIS — I69319 Unspecified symptoms and signs involving cognitive functions following cerebral infarction: Secondary | ICD-10-CM

## 2014-11-15 DIAGNOSIS — I69898 Other sequelae of other cerebrovascular disease: Secondary | ICD-10-CM | POA: Diagnosis not present

## 2014-11-15 NOTE — Therapy (Signed)
[facility] [address] [place], [place], [address] Phone: [phone]   Fax:  [phone]  Occupational Therapy Treatment  Patient Details  Name: George Armstrong MRN: [id number] Date of Birth: [birth year] Referring Provider:  Dion Body, MD  Encounter Date: 11/15/2014      OT End of Session - 11/15/14 0950    Visit Number 7   Number of Visits 17   Date for OT Re-Evaluation 12/21/14   Authorization Type [employer]   OT Start Time 0850   OT Stop Time 0935   OT Time Calculation (min) 45 min   Activity Tolerance Patient tolerated treatment well      Past Medical History  Diagnosis Date  . Diabetes mellitus without complication   . Hypertension     Past Surgical History  Procedure Laterality Date  . Cholecystectomy    . Ep implantable device N/A 10/17/2014    Procedure: Loop Recorder Insertion;  Surgeon: Deboraha Sprang, MD;  Location: [place] CV LAB;  Service: Cardiovascular;  Laterality: N/A;  . Tee without cardioversion N/A 10/17/2014    Procedure: TRANSESOPHAGEAL ECHOCARDIOGRAM (TEE);  Surgeon: Sanda Klein, MD;  Location: [facility] ENDOSCOPY;  Service: Cardiovascular;  Laterality: N/A;    There were no vitals filed for this visit.  Visit Diagnosis:  Cognitive deficits following cerebral infarction  Decreased functional activity tolerance  Hemiplegia affecting right dominant side      Subjective Assessment - 11/15/14 0855    Subjective  Everything at home is going really well.   Patient is accompained by: Family member   Pertinent History see Epic snapshot   Patient Stated Goals to ride lawnmower   Currently in Pain? No/denies                      OT Treatments/Exercises (OP) - 11/15/14 0001    ADLs   Cooking Treatment focused on cooking novel hot item with emphasis on following multi step instructions, working memory, Catering manager. Pt required min vc's to read  directions thoroughly (missed a step) ;  pt also required min questioning cues for time mgmt with task.  Session also addressed activity tolerance, and UE strength/functional use of UE's in work related task.                OT Education - 11/14/14 1107    Education provided Yes   Education Details Recommendation for use of phone alarm for reminding pt of medications   Person(s) Educated Patient;Spouse;Child(ren)   Methods Explanation   Comprehension Verbalized understanding          OT Short Term Goals - 11/15/14 0946    OT SHORT TERM GOAL #1   Title I with initial HEP - 11/20/2014   Time 4   Period Weeks   Status Achieved   OT SHORT TERM GOAL #2   Title Pt will demonstrate improved RUE fine motor coordination as evidenced by performing 9 hole peg test in 38 secs or less.   Time 4   Period Weeks   Status Achieved  25.53 on 11/08/2014   OT SHORT TERM GOAL #3   Title Pt will demonstrate improved activity tolerance as evidenced by performing functional activity in standing x 15 mins prior to rest break.   Time 4   Period Weeks   Status Achieved   OT SHORT TERM GOAL #4   Title Pt will demonstrate ability to follow directions for a simple functional or cooking  task with min A.   Time 4   Period Weeks   Status Achieved   OT SHORT TERM GOAL #5   Title Pt will demonstrate ability to place/ retrieve 5 lbs on overhead shelf  x 5 reps each UE without drops   Time 4   Period Weeks   Status Achieved   OT SHORT TERM GOAL #6   Title Pt will demonstrate ability to perform a physical and cognitive task simultaneously with 80% accuracy   Time 4   Period Weeks   Status On-going           OT Long Term Goals - 11/15/14 7824    OT LONG TERM GOAL #1   Title Pt will perform all basic ADLS modified independently - 12/18/2014   Time 8   Period Weeks   Status Achieved   OT LONG TERM GOAL #2   Title Pt will perform functional activity/ home management x 30-45 or greater mins in  standing without rest break.   Time 8   Period Weeks   Status On-going   OT LONG TERM GOAL #3   Title Pt will demonstrate ability to write a short paragraph in a reasonable amount of time with 100% legibility.   Time 8   Period Weeks   Status On-going   OT LONG TERM GOAL #4   Title Pt will demonstrate ability to follow instructions to complete a cooking task, demonstrating good safety awareness with supervision.   Time 8   Period Weeks   Status On-going   OT LONG TERM GOAL #5   Title Pt will demonstrate ability to lift and carry 25 lbs x 50 feet x3 reps in prep for work activities.   Time 8   Period Weeks   Status On-going   OT LONG TERM GOAL #6   Title Assess grip strength and set goal if  needed   Time 8   Period Weeks   Status Achieved  baseline R= 80 left = 85 pt right handed   OT LONG TERM GOAL #7   Title Pt will demonstrate increased grip strength by at least 20 pounds in R dominant hand to aid in functional tasks (baseline R= 80, left = 85)   Status Revised  met intiial goal 95 pounds on 11/08/2014 however pt reports he feels it is still a little weak compared to prior to stroke               Plan - 11/15/14 0949    Clinical Impression Statement Pt has made signficant progress toward goals showing improvement in RUE use, divided attention, and activity tolerance. Pt and wife report pt is slowly resuming activities at home.   Pt will benefit from skilled therapeutic intervention in order to improve on the following deficits (Retired) Abnormal gait;Decreased coordination;Decreased endurance;Decreased safety awareness;Decreased activity tolerance;Decreased balance;Decreased cognition;Decreased mobility;Decreased strength;Impaired perceived functional ability;Impaired UE functional use   Rehab Potential Good   Clinical Impairments Affecting Rehab Potential see above   OT Frequency 2x / week   OT Duration 8 weeks   OT Treatment/Interventions Moist Heat;Fluidtherapy;DME  and/or AE instruction;Patient/family education;Balance training;Therapeutic exercises;Ultrasound;Therapeutic exercise;Therapeutic activities;Cognitive remediation/compensation;Passive range of motion;Functional Mobility Training;Neuromuscular education;Cryotherapy;Parrafin;Energy conservation;Manual Therapy;Visual/perceptual remediation/compensation;Self-care/ADL training   Plan activity tolerance, writing   Consulted and Agree with Plan of Care Patient;Family member/caregiver   Family Member Consulted wife and son        Problem List Patient Active Problem List   Diagnosis Date Noted  . Essential hypertension  10/17/2014  . Obstructive hydrocephalus   . Cerebellar infarct 10/11/2014    Quay Burow, OTR/L 11/15/2014, 9:51 AM  [facility] [address] [place], [place], [address] Phone: [phone]   Fax:  [phone]

## 2014-11-15 NOTE — Therapy (Signed)
[facility] [address] [place], [place], [address] Phone: [phone]   Fax:  [phone]  Speech Language Pathology Treatment  Patient Details  Name: George Armstrong MRN: [id number] Date of Birth: 12/10/1960 Referring Provider:  Marisue IvanLinthavong, [name], MD  Encounter Date: 11/15/2014      End of Session - 11/15/14 1225    Visit Number 2   Number of Visits 16   Date for SLP Re-Evaluation 01/08/15   SLP Start Time 0803   SLP Stop Time  0847   SLP Time Calculation (min) 44 min   Activity Tolerance Patient tolerated treatment well      Past Medical History  Diagnosis Date  . Diabetes mellitus without complication   . Hypertension     Past Surgical History  Procedure Laterality Date  . Cholecystectomy    . Ep implantable device N/A 10/17/2014    Procedure: Loop Recorder Insertion;  Surgeon: Duke SalviaSteven C Klein, MD;  [facility];  Service: Cardiovascular;  Laterality: N/A;  . Tee without cardioversion N/A 10/17/2014    Procedure: TRANSESOPHAGEAL ECHOCARDIOGRAM (TEE);  Surgeon: Thurmon FairMihai Croitoru, MD;  [facility];  Service: Cardiovascular;  Laterality: N/A;    There were no vitals filed for this visit.  Visit Diagnosis: Aphasia             ADULT SLP TREATMENT - 11/15/14 0807    General Information   Behavior/Cognition Alert;Cooperative;Pleasant mood   Treatment Provided   Treatment provided Cognitive-Linquistic   Pain Assessment   Pain Assessment No/denies pain   Cognitive-Linquistic Treatment   Treatment focused on Aphasia   Skilled Treatment SLP faciliatated pt's aphasia thearpy by practicing word finding with opposites - pt req'd SLP semantic, phonemic, and demo cues occasionally. He participated in tasks enhancing semantic word generation with min cues usually.    Assessment / Recommendations / Plan   Plan Continue with current plan of care   Progression Toward Goals   Progression toward goals Progressing toward goals            SLP Short Term Goals - 11/15/14 1226    SLP SHORT TERM GOAL #1   Title pt will name 8 items in non-concrete category, average   Time 4   Period Weeks   Status On-going   SLP SHORT TERM GOAL #2   Title pt will demo >15 minutes of mod complex conversation using compensatory strategies in 90% of necessary opportunities   Time 4   Period Weeks   Status On-going          SLP Long Term Goals - 11/15/14 1227    SLP LONG TERM GOAL #1   Title pt will demo >20 minutes of mod complex conversation with use of compensations at 95% success, over three sessions   Time 8   Period Weeks   Status On-going          Plan - 11/15/14 1225    Clinical Impression Statement Pt's anomia evidenced in structured word finding tasks, and in conversation. PT still relies on family for assistance more often than him using compensations.   Speech Therapy Frequency 2x / week   Duration --  8 weeks   Treatment/Interventions Patient/family education;Compensatory strategies;Internal/external aids;SLP instruction and feedback;Functional tasks;Cueing hierarchy   Potential to Achieve Goals Good        Problem List Patient Active Problem List   Diagnosis Date Noted  . Essential hypertension 10/17/2014  . Obstructive hydrocephalus   .  Cerebellar infarct 10/11/2014    [facility] , MS, CCC-SLP  11/15/2014, 12:27 PM  [facility] [address] [place], [place], [address] Phone: [phone]   Fax:  [phone]

## 2014-11-15 NOTE — Patient Instructions (Signed)
  Please complete the assigned speech therapy homework and return it to your next session.  

## 2014-11-16 ENCOUNTER — Ambulatory Visit (INDEPENDENT_AMBULATORY_CARE_PROVIDER_SITE_OTHER): Admitting: *Deleted

## 2014-11-16 DIAGNOSIS — I639 Cerebral infarction, unspecified: Secondary | ICD-10-CM

## 2014-11-20 ENCOUNTER — Encounter: Payer: Self-pay | Admitting: Occupational Therapy

## 2014-11-20 ENCOUNTER — Ambulatory Visit: Attending: Neurology

## 2014-11-20 ENCOUNTER — Ambulatory Visit: Admitting: Occupational Therapy

## 2014-11-20 DIAGNOSIS — I69898 Other sequelae of other cerebrovascular disease: Secondary | ICD-10-CM | POA: Diagnosis present

## 2014-11-20 DIAGNOSIS — I6931 Cognitive deficits following cerebral infarction: Secondary | ICD-10-CM | POA: Diagnosis present

## 2014-11-20 DIAGNOSIS — R531 Weakness: Secondary | ICD-10-CM | POA: Diagnosis present

## 2014-11-20 DIAGNOSIS — R4701 Aphasia: Secondary | ICD-10-CM | POA: Diagnosis present

## 2014-11-20 DIAGNOSIS — I69319 Unspecified symptoms and signs involving cognitive functions following cerebral infarction: Secondary | ICD-10-CM

## 2014-11-20 DIAGNOSIS — Z7409 Other reduced mobility: Secondary | ICD-10-CM | POA: Diagnosis present

## 2014-11-20 DIAGNOSIS — R6889 Other general symptoms and signs: Secondary | ICD-10-CM | POA: Insufficient documentation

## 2014-11-20 DIAGNOSIS — G8191 Hemiplegia, unspecified affecting right dominant side: Secondary | ICD-10-CM | POA: Diagnosis present

## 2014-11-20 DIAGNOSIS — R41841 Cognitive communication deficit: Secondary | ICD-10-CM

## 2014-11-20 NOTE — Therapy (Signed)
[place] [address] [place], [place], [address] Phone: [phone]   Fax:  [phone]  Occupational Therapy Treatment  Patient Details  Name: PARDEEP PAUTZ MRN: [id number] Date of Birth: 1960/11/15 Referring Provider:  Dion Body, MD  Encounter Date: 11/20/2014      OT End of Session - 11/20/14 1738    Visit Number 8   Number of Visits 17   Date for OT Re-Evaluation 12/21/14   Authorization Type [employer]   OT Start Time 1531   OT Stop Time 0321   OT Time Calculation (min) 43 min   Activity Tolerance Patient tolerated treatment well      Past Medical History  Diagnosis Date  . Diabetes mellitus without complication   . Hypertension     Past Surgical History  Procedure Laterality Date  . Cholecystectomy    . Ep implantable device N/A 10/17/2014    Procedure: Loop Recorder Insertion;  Surgeon: Deboraha Sprang, MD;  Location: [place] CV LAB;  Service: Cardiovascular;  Laterality: N/A;  . Tee without cardioversion N/A 10/17/2014    Procedure: TRANSESOPHAGEAL ECHOCARDIOGRAM (TEE);  Surgeon: Sanda Klein, MD;  Location: [facility] ENDOSCOPY;  Service: Cardiovascular;  Laterality: N/A;    There were no vitals filed for this visit.  Visit Diagnosis:  Cognitive deficits following cerebral infarction  Decreased functional activity tolerance  Hemiplegia affecting right dominant side      Subjective Assessment - 11/20/14 1539    Subjective  I drove my riding lawnmower this week   Patient is accompained by: Family member   Pertinent History see Epic snapshot   Patient Stated Goals to ride lawnmower   Currently in Pain? No/denies                      OT Treatments/Exercises (OP) - 11/20/14 0001    ADLs   Work Addressed pre work Psychologist, occupational of lifting, carrying and walking with 25 pound load while doing a cognitive task. Pt able to complete task however slightly slower with cognitive  processing. Pt fatigued at end of physical task.   Cognitive Exercises   Other Cognitive Exercises 2 Addressed cognitive and physical task simultaneously. Also addressed alternating attention task as well as simple organizational task. Pt given organizational task to do at home.  Difficult to tell pt's premorbid skills in these areas as wife did most of the planning, organizing and financial mgmt.                   OT Short Term Goals - 11/20/14 1735    OT SHORT TERM GOAL #1   Title I with initial HEP - 11/20/2014   Time 4   Period Weeks   Status Achieved   OT SHORT TERM GOAL #2   Title Pt will demonstrate improved RUE fine motor coordination as evidenced by performing 9 hole peg test in 38 secs or less.   Time 4   Period Weeks   Status Achieved  25.53 on 11/08/2014   OT SHORT TERM GOAL #3   Title Pt will demonstrate improved activity tolerance as evidenced by performing functional activity in standing x 15 mins prior to rest break.   Time 4   Period Weeks   Status Achieved   OT SHORT TERM GOAL #4   Title Pt will demonstrate ability to follow directions for a simple functional or cooking task with min A.   Time 4   Period Weeks  Status Achieved   OT SHORT TERM GOAL #5   Title Pt will demonstrate ability to place/ retrieve 5 lbs on overhead shelf  x 5 reps each UE without drops   Time 4   Period Weeks   Status Achieved   OT SHORT TERM GOAL #6   Title Pt will demonstrate ability to perform a physical and cognitive task simultaneously with 80% accuracy   Time 4   Period Weeks   Status Achieved           OT Long Term Goals - 11/20/14 1735    OT LONG TERM GOAL #1   Title Pt will perform all basic ADLS modified independently - 12/18/2014   Time 8   Period Weeks   Status Achieved   OT LONG TERM GOAL #2   Title Pt will perform functional activity/ home management x 30-45 or greater mins in standing without rest break.   Time 8   Period Weeks   Status On-going   OT  LONG TERM GOAL #3   Title Pt will demonstrate ability to write a short paragraph in a reasonable amount of time with 100% legibility.   Time 8   Period Weeks   Status On-going   OT LONG TERM GOAL #4   Title Pt will demonstrate ability to follow instructions to complete a cooking task, demonstrating good safety awareness with supervision.   Time 8   Period Weeks   Status On-going   OT LONG TERM GOAL #5   Title Pt will demonstrate ability to lift and carry 25 lbs x 50 feet x3 reps in prep for work activities.   Time 8   Period Weeks   Status On-going   OT LONG TERM GOAL #6   Title Assess grip strength and set goal if  needed   Time 8   Period Weeks   Status Achieved  baseline R= 80 left = 85 pt right handed   OT LONG TERM GOAL #7   Title Pt will demonstrate increased grip strength by at least 20 pounds in R dominant hand to aid in functional tasks (baseline R= 80, left = 85)   Status Revised  met intiial goal 95 pounds on 11/08/2014 however pt reports he feels it is still a little weak compared to prior to stroke               Plan - 11/20/14 1736    Clinical Impression Statement Pt continues to make progress and is now mowing lawn and cooking at home. Pt also given medical clearance to drive with supervision with wife only, during low traffic times and only familiar roads (no highways at this time).     Pt will benefit from skilled therapeutic intervention in order to improve on the following deficits (Retired) Abnormal gait;Decreased coordination;Decreased endurance;Decreased safety awareness;Decreased activity tolerance;Decreased balance;Decreased cognition;Decreased mobility;Decreased strength;Impaired perceived functional ability;Impaired UE functional use   Rehab Potential Good   Clinical Impairments Affecting Rehab Potential see above   OT Frequency 2x / week   OT Duration 8 weeks   OT Treatment/Interventions Moist Heat;Fluidtherapy;DME and/or AE  instruction;Patient/family education;Balance training;Therapeutic exercises;Ultrasound;Therapeutic exercise;Therapeutic activities;Cognitive remediation/compensation;Passive range of motion;Functional Mobility Training;Neuromuscular education;Cryotherapy;Parrafin;Energy conservation;Manual Therapy;Visual/perceptual remediation/compensation;Self-care/ADL training   Plan check organizational task given for homework, continue to address work readiness  and higher level cogntive skills.   Consulted and Agree with Plan of Care Patient;Family member/caregiver   Family Member Consulted wife and son        Problem List  Patient Active Problem List   Diagnosis Date Noted  . Essential hypertension 10/17/2014  . Obstructive hydrocephalus   . Cerebellar infarct 10/11/2014    Quay Burow, OTR/L 11/20/2014, 5:39 PM  [place] [address] [place], [place], [address] Phone: [phone]   Fax:  [phone]

## 2014-11-20 NOTE — Therapy (Signed)
[facility] [address] [place], [place], [address] Phone: [phone]   Fax:  [phone]  Speech Language Pathology Treatment  Patient Details  Name: George Armstrong MRN: [id number] Date of Birth: [birth year] Referring Provider:  [name], MD  Encounter Date: 11/20/2014      End of Session - 11/20/14 1451    Visit Number 3   Number of Visits 16   Date for SLP Re-Evaluation 01/08/15   SLP Start Time 1405   SLP Stop Time  1449   SLP Time Calculation (min) 44 min   Activity Tolerance Patient tolerated treatment well      Past Medical History  Diagnosis Date  . Diabetes mellitus without complication   . Hypertension     Past Surgical History  Procedure Laterality Date  . Cholecystectomy    . Ep implantable device N/A 10/17/2014    Procedure: Loop Recorder Insertion;  Surgeon: Duke Salvia, MD;  [facility] INVAS[facility] CV LAB;  Service: Cardiovascular;  Laterality: N/A;  . Tee without cardioversion N/A 10/17/2014    Procedure: TRANSESOPHAGEAL ECHOCARDIOGRAM (TEE);  Surgeon: Thurmon Fair, MD;  Location: [facility] ENDOSCOPY;  Service: Cardiovascular;  Laterality: N/A;    There were no vitals filed for this visit.  Visit Diagnosis: Aphasia  Cognitive communication deficit      Subjective Assessment - 11/20/14 1410    Subjective Pt reports talking is better than previous, reports improvement.   Patient is accompained by: --  son,wife               ADULT SLP TREATMENT - 11/20/14 1411    General Information   Behavior/Cognition Alert;Cooperative;Pleasant mood   Treatment Provided   Treatment provided Cognitive-Linquistic   Pain Assessment   Pain Assessment No/denies pain   Cognitive-Linquistic Treatment   Treatment focused on Aphasia   Skilled Treatment In sentence tasks, pt required min verbal cues once in 40 opportunities. Sturctured tasks with wordifinding as target req'd rare min-mod  verbal A. Pt with noted word finding deficits in conversation which pt addressed with compensations of description as well as synonym and circumlocution. Pt looked at wife x1 to assist but wife told pt he could do independently, which he did. SLP's skilled observation noted pt difficulty with impulsivity/decr'd inhibition as he showed wife/son video on his phone after SLP arrived back in therapy room after making copies, and SLP with nonverbal cues as clearly ready to review homework.   Assessment / Recommendations / Plan   Plan Continue with current plan of care   Progression Toward Goals   Progression toward goals Progressing toward goals            SLP Short Term Goals - 11/20/14 1412    SLP SHORT TERM GOAL #1   Title pt will name 8 items in non-concrete category, average   Time 3   Period Weeks   Status On-going   SLP SHORT TERM GOAL #2   Title pt will demo >15 minutes of mod complex conversation using compensatory strategies in 90% of necessary opportunities   Time 3   Period Weeks   Status On-going          SLP Long Term Goals - 11/20/14 1412    SLP LONG TERM GOAL #1   Title pt will demo >20 minutes of mod complex conversation with use of compensations at 95% success, over three sessions   Time 7   Period Weeks   Status On-going  Plan - 11/20/14 1639    Clinical Impression Statement Severity of pt's anomia evidenced more in structured tasks than in conversation. SLP ID'd possible cognitive-linguistic deficits today. SLP to monitor and add goals as necessary.   Speech Therapy Frequency 2x / week   Duration --  7 weeks   Treatment/Interventions Patient/family education;Compensatory strategies;Internal/external aids;SLP instruction and feedback;Functional tasks;Cueing hierarchy;Cognitive reorganization   Potential to Achieve Goals Good        Problem List Patient Active Problem List   Diagnosis Date Noted  . Essential hypertension 10/17/2014  .  Obstructive hydrocephalus   . Cerebellar infarct 10/11/2014    [facility] Pamer , MS, CCC-SLP   11/20/2014, 4:41 PM  [facility] [address] [place], [place], [address] Phone: [phone]   Fax:  [phone]

## 2014-11-20 NOTE — Patient Instructions (Signed)
  Please complete the assigned speech therapy homework and return it to your next session.  

## 2014-11-21 LAB — CUP PACEART REMOTE DEVICE CHECK: Date Time Interrogation Session: 20160706171208

## 2014-11-21 NOTE — Progress Notes (Signed)
Loop recorder 

## 2014-11-22 ENCOUNTER — Ambulatory Visit: Admitting: Occupational Therapy

## 2014-11-22 ENCOUNTER — Encounter: Payer: Self-pay | Admitting: Occupational Therapy

## 2014-11-22 DIAGNOSIS — G8191 Hemiplegia, unspecified affecting right dominant side: Secondary | ICD-10-CM

## 2014-11-22 DIAGNOSIS — R6889 Other general symptoms and signs: Secondary | ICD-10-CM

## 2014-11-22 DIAGNOSIS — I69319 Unspecified symptoms and signs involving cognitive functions following cerebral infarction: Secondary | ICD-10-CM

## 2014-11-22 DIAGNOSIS — R4701 Aphasia: Secondary | ICD-10-CM | POA: Diagnosis not present

## 2014-11-22 NOTE — Therapy (Signed)
[place] [address] [place], [place], [address] Phone: [phone]   Fax:  [phone]  Occupational Therapy Treatment  Patient Details  Name: George Armstrong MRN: [id number] Date of Birth: 1960/08/31 Referring Provider:  Dion Body, MD  Encounter Date: 11/22/2014      OT End of Session - 11/22/14 1739    Visit Number 9   Number of Visits 17   Date for OT Re-Evaluation 12/21/14   Authorization Type [employer]   OT Start Time 3536   OT Stop Time 1659   OT Time Calculation (min) 42 min   Activity Tolerance Patient tolerated treatment well      Past Medical History  Diagnosis Date  . Diabetes mellitus without complication   . Hypertension     Past Surgical History  Procedure Laterality Date  . Cholecystectomy    . Ep implantable device N/A 10/17/2014    Procedure: Loop Recorder Insertion;  Surgeon: Deboraha Sprang, MD;  Location: [facility] CV LAB;  Service: Cardiovascular;  Laterality: N/A;  . Tee without cardioversion N/A 10/17/2014    Procedure: TRANSESOPHAGEAL ECHOCARDIOGRAM (TEE);  Surgeon: Sanda Klein, MD;  Location: [facility] ENDOSCOPY;  Service: Cardiovascular;  Laterality: N/A;    There were no vitals filed for this visit.  Visit Diagnosis:  Cognitive deficits following cerebral infarction  Decreased functional activity tolerance  Hemiplegia affecting right dominant side      Subjective Assessment - 11/22/14 1626    Subjective  I did my homework   Patient is accompained by: Family member   Pertinent History see Epic snapshot   Patient Stated Goals to ride lawnmower   Currently in Pain? No/denies                      OT Treatments/Exercises (OP) - 11/22/14 0001    ADLs   Work Pt able to carry 25 pounds for 50 feet x4 today without difficulty. Pt also able to lift 25# plus overhead 3 times. Pt has to lift and carry heavy objects at work. Pt could benefit from functional work  capacity evaluation and potential work Teaching laboratory technician. Discussed with pt and wife and they are in agreement given what pt does for a living.   Cognitive Exercises   Other Cognitive Exercises 1 Addressed novel organizational task that required alternating attention. Pt with min mistakes but able to self correct when reviewing. Discussed functional implications.                  OT Short Term Goals - 11/22/14 1738    OT SHORT TERM GOAL #1   Title I with initial HEP - 11/20/2014   Time 4   Period Weeks   Status Achieved   OT SHORT TERM GOAL #2   Title Pt will demonstrate improved RUE fine motor coordination as evidenced by performing 9 hole peg test in 38 secs or less.   Time 4   Period Weeks   Status Achieved  25.53 on 11/08/2014   OT SHORT TERM GOAL #3   Title Pt will demonstrate improved activity tolerance as evidenced by performing functional activity in standing x 15 mins prior to rest break.   Time 4   Period Weeks   Status Achieved   OT SHORT TERM GOAL #4   Title Pt will demonstrate ability to follow directions for a simple functional or cooking task with min A.   Time 4   Period Weeks  Status Achieved   OT SHORT TERM GOAL #5   Title Pt will demonstrate ability to place/ retrieve 5 lbs on overhead shelf  x 5 reps each UE without drops   Time 4   Period Weeks   Status Achieved   OT SHORT TERM GOAL #6   Title Pt will demonstrate ability to perform a physical and cognitive task simultaneously with 80% accuracy   Time 4   Period Weeks   Status Achieved           OT Long Term Goals - 11/22/14 1738    OT LONG TERM GOAL #1   Title Pt will perform all basic ADLS modified independently - 12/18/2014   Time 8   Period Weeks   Status Achieved   OT LONG TERM GOAL #2   Title Pt will perform functional activity/ home management x 30-45 or greater mins in standing without rest break.   Time 8   Period Weeks   Status On-going   OT LONG TERM GOAL #3   Title Pt will  demonstrate ability to write a short paragraph in a reasonable amount of time with 100% legibility.   Time 8   Period Weeks   Status Achieved   OT LONG TERM GOAL #4   Title Pt will demonstrate ability to follow instructions to complete a cooking task, demonstrating good safety awareness with supervision.   Time 8   Period Weeks   Status On-going   OT LONG TERM GOAL #5   Title Pt will demonstrate ability to lift and carry 25 lbs x 50 feet x3 reps in prep for work activities.   Time 8   Period Weeks   Status Achieved   OT LONG TERM GOAL #6   Title Assess grip strength and set goal if  needed   Time 8   Period Weeks   Status Achieved  baseline R= 80 left = 85 pt right handed   OT LONG TERM GOAL #7   Title Pt will demonstrate increased grip strength by at least 20 pounds in R dominant hand to aid in functional tasks (baseline R= 80, left = 85)   Status Achieved  met intiial goal 95 pounds on 11/08/2014 however pt reports he feels it is still a little weak compared to prior to stroke               Plan - 11/22/14 1738    Clinical Impression Statement Pt making excellent progress toward goals and is very motivated to be as independent as possible.   Pt will benefit from skilled therapeutic intervention in order to improve on the following deficits (Retired) Abnormal gait;Decreased coordination;Decreased endurance;Decreased safety awareness;Decreased activity tolerance;Decreased balance;Decreased cognition;Decreased mobility;Decreased strength;Impaired perceived functional ability;Impaired UE functional use   Rehab Potential Good   Clinical Impairments Affecting Rehab Potential see above   OT Frequency 2x / week   OT Duration 8 weeks   OT Treatment/Interventions Moist Heat;Fluidtherapy;DME and/or AE instruction;Patient/family education;Balance training;Therapeutic exercises;Ultrasound;Therapeutic exercise;Therapeutic activities;Cognitive remediation/compensation;Passive range of  motion;Functional Mobility Training;Neuromuscular education;Cryotherapy;Parrafin;Energy conservation;Manual Therapy;Visual/perceptual remediation/compensation;Self-care/ADL training   Plan activity tolerance, address remaining goals.   Consulted and Agree with Plan of Care Patient;Family member/caregiver   Family Member Consulted wife and son        Problem List Patient Active Problem List   Diagnosis Date Noted  . Essential hypertension 10/17/2014  . Obstructive hydrocephalus   . Cerebellar infarct 10/11/2014    Quay Burow, OTR/L 11/22/2014, 5:42 PM  [place]  [facility] [address] [place], [place], [address] Phone: [phone]   Fax:  [phone]

## 2014-11-23 ENCOUNTER — Ambulatory Visit

## 2014-11-23 DIAGNOSIS — R41841 Cognitive communication deficit: Secondary | ICD-10-CM

## 2014-11-23 DIAGNOSIS — R4701 Aphasia: Secondary | ICD-10-CM

## 2014-11-23 NOTE — Therapy (Signed)
[facility] [address] [place], [place], [address] Phone: [phone]   Fax:  [phone]  Speech Language Pathology Treatment  Patient Details  Name: George Armstrong MRN: [id number] Date of Birth: 05/13/1961 Referring Provider:  Marisue IvanLinthavong, [name], MD  Encounter Date: 11/23/2014      End of Session - 11/23/14 [id number]    Visit Number 4   Number of Visits 16   Date for SLP Re-Evaluation 01/08/15   SLP Start Time 0850   SLP Stop Time  0935   SLP Time Calculation (min) 45 min   Activity Tolerance Patient tolerated treatment well      Past Medical History  Diagnosis Date  . Diabetes mellitus without complication   . Hypertension     Past Surgical History  Procedure Laterality Date  . Cholecystectomy    . Ep implantable device N/A 10/17/2014    Procedure: Loop Recorder Insertion;  Surgeon: Duke SalviaSteven C Klein, MD;  [facility];  Service: Cardiovascular;  Laterality: N/A;  . Tee without cardioversion N/A 10/17/2014    Procedure: TRANSESOPHAGEAL ECHOCARDIOGRAM (TEE);  Surgeon: Thurmon FairMihai Croitoru, MD;  [facility];  Service: Cardiovascular;  Laterality: N/A;    There were no vitals filed for this visit.  Visit Diagnosis: Cognitive communication deficit  Aphasia      Subjective Assessment - 11/23/14 0854    Patient is accompained by: Family member  son, wife               ADULT SLP TREATMENT - 11/23/14 0856    General Information   Behavior/Cognition Alert;Cooperative;Pleasant mood   Treatment Provided   Treatment provided Cognitive-Linquistic   Pain Assessment   Pain Assessment No/denies pain   Cognitive-Linquistic Treatment   Treatment focused on Aphasia   Skilled Treatment (Speech tx<30 minutes): Pt completed simple word finding tasks with rare min A. (cog-linguistic skills rehab -30 min): Pt completed Self Administered Gerocognitive Evaluation (SAGE) during session today. Wife  reports pt's cognitive-linguistic skills are [facility] and that pt is now driving with some restrictions. Pt finished SAGE in 20+ minutes. Pt had difficulty with trail making, detailed instructions, and simple math. Discussed with pt/wife suggestions on thinking how pt can regain medication administration responsibilities. Homework is to talk with wife and see what system would work best for self-administration of meds.   Assessment / Recommendations / Plan   Plan Continue with current plan of care   Progression Toward Goals   Progression toward goals Progressing toward goals          SLP Education - 11/23/14 0956    Education provided Yes   Education Details medicaiton administration, divided attention tasks   Person(s) Educated Patient;Spouse;Child(ren)   Methods Explanation, handout   Comprehension Verbalized understanding;Need further instruction          SLP Short Term Goals - 11/23/14 0913    SLP SHORT TERM GOAL #1   Title pt will name 8 items in non-concrete category, average   Time 3   Period Weeks   Status On-going   SLP SHORT TERM GOAL #2   Title pt will demo >15 minutes of mod complex conversation using compensatory strategies in 90% of necessary opportunities   Time 3   Period Weeks   Status On-going   SLP SHORT TERM GOAL #3   Title Pt will report appropriate independent use of medication administration system of his choosing for one week    Time  3   Period Weeks   Status New          SLP Long Term Goals - 11/23/14 0913    SLP LONG TERM GOAL #1   Title pt will demo >20 minutes of mod complex conversation with use of compensations at 95% success, over three sessions   Time 7   Period Weeks   Status On-going   SLP LONG TERM GOAL #2   Title pt will demo appropriate medication administration for two weeks using the system of his own choosing   Time 7   Period Weeks   Status New          Plan - 11/23/14 0915    Clinical Impression  Statement Pt rates anomia 10/10, wife rates 9/10. Pt took SAGE today and therapy will focus on cognitive linguistic tasks.   Speech Therapy Frequency 2x / week   Duration --  7 weeks   Treatment/Interventions Patient/family education;Compensatory strategies;Internal/external aids;SLP instruction and feedback;Functional tasks;Cueing hierarchy;Cognitive reorganization   Potential to Achieve Goals Good        Problem List Patient Active Problem List   Diagnosis Date Noted  . Essential hypertension 10/17/2014  . Obstructive hydrocephalus   . Cerebellar infarct 10/11/2014    [facility] , MS, CCC-SLP  11/23/2014, 9:59 AM  [facility] [address] [place], [place], [address] Phone: [phone]   Fax:  [phone]

## 2014-11-23 NOTE — Patient Instructions (Signed)
   Please complete the assigned speech therapy homework and return it to your next session.  Work on homework for 20 minutes, twice a day

## 2014-11-28 ENCOUNTER — Ambulatory Visit

## 2014-11-28 ENCOUNTER — Ambulatory Visit: Admitting: Occupational Therapy

## 2014-11-28 DIAGNOSIS — R6889 Other general symptoms and signs: Secondary | ICD-10-CM

## 2014-11-28 DIAGNOSIS — R4701 Aphasia: Secondary | ICD-10-CM | POA: Diagnosis not present

## 2014-11-28 DIAGNOSIS — Z7409 Other reduced mobility: Secondary | ICD-10-CM

## 2014-11-28 DIAGNOSIS — I69319 Unspecified symptoms and signs involving cognitive functions following cerebral infarction: Secondary | ICD-10-CM

## 2014-11-28 DIAGNOSIS — R41841 Cognitive communication deficit: Secondary | ICD-10-CM

## 2014-11-28 DIAGNOSIS — IMO0002 Reserved for concepts with insufficient information to code with codable children: Secondary | ICD-10-CM

## 2014-11-28 NOTE — Patient Instructions (Signed)
  Strengthening: Resisted Flexion   Hold tubing with __each___ arm(s) at side. Pull forward and up. Move shoulder through pain-free range of motion. Repeat __10__ times per set.  Do _1-2_ sessions per day , every other day   Strengthening: Resisted Extension   Hold tubing in ___each__ hand(s), arm forward. Pull arm back, elbow straight. Repeat _10___ times per set. Do _1-2___ sessions per day, every other day.   Resisted Horizontal Abduction: Bilateral   Sit or stand, tubing in both hands, arms out in front. Keeping arms straight, pinch shoulder blades together and stretch arms out. Repeat _10___ times per set. Do _1-2___ sessions per day, every other day.   Elbow Flexion: Resisted   With tubing held in __each____ hand(s) and other end secured under foot, curl arm up as far as possible. Repeat _10___ times per set. Do _1-2___ sessions per day, every other day.    Elbow Extension: Resisted   Sit in chair with resistive band secured at armrest (or hold with other hand) and __one _____ elbow bent. Straighten elbow. Repeat _10___ times per set.  Do _1-2___ sessions per day, every other day.   Copyright  V[employer]. All rights reserved.   

## 2014-11-28 NOTE — Therapy (Signed)
[facility] [address] [place], [place], [address] Phone: [phone]   Fax:  [phone]  Speech Language Pathology Treatment  Patient Details  Name: George Armstrong MRN: [id number] Date of Birth: 11/14/1960 Referring Provider:  Marisue IvanLinthavong, [name], MD  Encounter Date: 11/28/2014      End of Session - 11/28/14 1722    Visit Number 5   Number of Visits 16   Date for SLP Re-Evaluation 01/08/15   SLP Start Time 0935   SLP Stop Time  1016   SLP Time Calculation (min) 41 min   Activity Tolerance Patient tolerated treatment well      Past Medical History  Diagnosis Date  . Diabetes mellitus without complication   . Hypertension     Past Surgical History  Procedure Laterality Date  . Cholecystectomy    . Ep implantable device N/A 10/17/2014    Procedure: Loop Recorder Insertion;  Surgeon: Duke SalviaSteven C Klein, MD;  [facility];  Service: Cardiovascular;  Laterality: N/A;  . Tee without cardioversion N/A 10/17/2014    Procedure: TRANSESOPHAGEAL ECHOCARDIOGRAM (TEE);  Surgeon: Thurmon FairMihai Croitoru, MD;  [facility];  Service: Cardiovascular;  Laterality: N/A;    There were no vitals filed for this visit.  Visit Diagnosis: Cognitive communication deficit  Aphasia      Subjective Assessment - 11/28/14 0943    Subjective Pt reports talking is better than previous, reports improvement.               ADULT SLP TREATMENT - 11/28/14 1006    General Information   Behavior/Cognition Alert;Cooperative;Pleasant mood   Treatment Provided   Treatment provided Cognitive-Linquistic   Pain Assessment   Pain Assessment No/denies pain   Cognitive-Linquistic Treatment   Treatment focused on Cognition   Skilled Treatment Cognitive-linguistics: SLP targeted divided attention in therapy today with a simple written task and an auditory task - pt with alternating attention primarily with very little  recall of auditory information. SLP provided mod-max A for >1-2 facts from approx 60 second news story (auditory).   Assessment / Recommendations / Plan   Plan Continue with current plan of care;Goals updated   Progression Toward Goals   Progression toward goals Progressing toward goals          SLP Education - 11/28/14 1724    Education provided Yes   Education Details attention in daily activities (driving), attention tasks to try at home   Person(s) Educated Patient;Spouse   Methods Explanation   Comprehension Verbalized understanding          SLP Short Term Goals - 11/28/14 1725    SLP SHORT TERM GOAL #1   Title pt will name 8 items in non-concrete category, average   Time 3   Period Weeks   Status On-going   SLP SHORT TERM GOAL #2   Title pt will demo >15 minutes of mod complex conversation using compensatory strategies in 90% of necessary opportunities   Time 3   Period Weeks   Status On-going   SLP SHORT TERM GOAL #3   Title Pt will report appropriate independent use of medication administration system of his choosing for one week    Time 3   Period Weeks   Status New   SLP SHORT TERM GOAL #4   Title Pt will demo appropriate divided attention skills in simple cognitive-linguistic tasks 60% of the time   Time 2   Period Weeks   Status New  SLP Long Term Goals - 11/28/14 1726    SLP LONG TERM GOAL #1   Title pt will demo >20 minutes of mod complex conversation with use of compensations at 95% success, over three sessions   Time 6   Period Weeks   Status On-going   SLP LONG TERM GOAL #2   Title pt will demo appropriate medication administration for two weeks using the system of his own choosing   Time 6   Period Weeks   Status On-going   SLP LONG TERM GOAL #3   Title pt will demo appropriate divided attention in simple cognitive-linguistic tasks with 80% success   Time 6   Period Weeks   Status New          Plan - 11/28/14 1723     Clinical Impression Statement Pt with obvious difficulties with higher level attention skills. SLP took time today to stress why those skills necessary to complete daily tasks, including driving. Skilled ST remains needed to maximize pt cognitive-lingustic skills in hopes of return to work.   Speech Therapy Frequency 2x / week   Duration --  6 weeks   Treatment/Interventions Patient/family education;Compensatory strategies;Internal/external aids;SLP instruction and feedback;Functional tasks;Cueing hierarchy;Cognitive reorganization   Potential to Achieve Goals Good        Problem List Patient Active Problem List   Diagnosis Date Noted  . Essential hypertension 10/17/2014  . Obstructive hydrocephalus   . Cerebellar infarct 10/11/2014    [facility] , MS, CCC-SLP  11/28/2014, 5:27 PM  [facility] [address] [place], [place], [address] Phone: [phone]   Fax:  [phone]

## 2014-11-28 NOTE — Patient Instructions (Signed)
  Please complete the assigned speech therapy homework and return it to your next session.  

## 2014-11-29 ENCOUNTER — Ambulatory Visit: Admitting: Occupational Therapy

## 2014-11-29 ENCOUNTER — Ambulatory Visit: Admitting: Speech Pathology

## 2014-11-29 DIAGNOSIS — R41841 Cognitive communication deficit: Secondary | ICD-10-CM

## 2014-11-29 DIAGNOSIS — R6889 Other general symptoms and signs: Secondary | ICD-10-CM

## 2014-11-29 DIAGNOSIS — R4701 Aphasia: Secondary | ICD-10-CM

## 2014-11-29 DIAGNOSIS — I69319 Unspecified symptoms and signs involving cognitive functions following cerebral infarction: Secondary | ICD-10-CM

## 2014-11-29 DIAGNOSIS — IMO0002 Reserved for concepts with insufficient information to code with codable children: Secondary | ICD-10-CM

## 2014-11-29 NOTE — Therapy (Signed)
[facility] [address] [place], [place], [address] Phone: [phone]   Fax:  [phone]  Occupational Therapy Treatment  Patient Details  Name: George Armstrong MRN: [id number] Date of Birth: 11/28/1960 Referring Provider:  Dr. Sethi  Encounter Date: 11/28/2014      OT End of Session - 11/28/14 1747    Visit Number 10   Number of Visits 17   Authorization Type [employer]   Authorization - Visit Number 10   Authorization - Number of Visits 10   OT Start Time 0850   OT Stop Time 0930   OT Time Calculation (min) 40 min   Activity Tolerance Patient tolerated treatment well   Behavior During Therapy WFL for tasks assessed/performed      Past Medical History  Diagnosis Date  . Diabetes mellitus without complication   . Hypertension     Past Surgical History  Procedure Laterality Date  . Cholecystectomy    . Ep implantable device N/A 10/17/2014    Procedure: Loop Recorder Insertion;  Surgeon: [name], MD;  [facility];  Service: Cardiovascular;  Laterality: N/A;  . Tee without cardioversion N/A 10/17/2014    Procedure: TRANSESOPHAGEAL ECHOCARDIOGRAM (TEE);  Surgeon: [name], MD;  Location: [facility]OS[facility];  Service: Cardiovascular;  Laterality: N/A;    There were no vitals filed for this visit.  Visit Diagnosis:  Cognitive deficits following cerebral infarction  Impaired functional mobility, balance, gait, and endurance  Decreased functional activity tolerance  Weakness due to cerebrovascular accident      Subjective Assessment - 11/28/14 1743    Subjective  Pt reports grilling at home   Patient is accompained by: Family member   Pertinent History see Epic snapshot   Patient Stated Goals to ride lawnmower   Currently in Pain? No/denies         Treatment: Self care:Pt performed simple cooking task to make noodle mix. Pt located items and followed instructions. While  stirring mix with a tablespoon, pt dropped the spoon into hot pan. Min v.c. To use tongs to retrieve and to use longer spoon. At end of task pt walked away from the stove with burner still on, questioning cue provided and pt turned off stove. mTherapist recommends continued supervision for cooking due to decreased attetion to fine detail. Pt's wife was present. Theraputic ex:Pt was instructed in a black theraband HEP for increased strength and endurance. 10 reps each exercise performed, min instructional cues. See pt. Instructions.                     OT Education - 11/28/14 1744    Education provided Yes   Education Details theraband HEP   Person(s) Educated Patient;Spouse   Methods Explanation;Handout   Comprehension Verbalized understanding;Returned demonstration          OT Short Term Goals - 11/22/14 1738    OT SHORT TERM GOAL #1   Title I with initial HEP - 11/20/2014   Time 4   Period Weeks   Status Achieved   OT SHORT TERM GOAL #2   Title Pt will demonstrate improved RUE fine motor coordination as evidenced by performing 9 hole peg test in 38 secs or less.   Time 4   Period Weeks   Status Achieved  25.53 on 11/08/2014   OT SHORT TERM GOAL #3   Title Pt will demonstrate improved activity tolerance as evidenced by performing functional activity in standing x 15   mins prior to rest break.   Time 4   Period Weeks   Status Achieved   OT SHORT TERM GOAL #4   Title Pt will demonstrate ability to follow directions for a simple functional or cooking task with min A.   Time 4   Period Weeks   Status Achieved   OT SHORT TERM GOAL #5   Title Pt will demonstrate ability to place/ retrieve 5 lbs on overhead shelf  x 5 reps each UE without drops   Time 4   Period Weeks   Status Achieved   OT SHORT TERM GOAL #6   Title Pt will demonstrate ability to perform a physical and cognitive task simultaneously with 80% accuracy   Time 4   Period Weeks   Status Achieved            OT Long Term Goals - 11/22/14 1738    OT LONG TERM GOAL #1   Title Pt will perform all basic ADLS modified independently - 12/18/2014   Time 8   Period Weeks   Status Achieved   OT LONG TERM GOAL #2   Title Pt will perform functional activity/ home management x 30-45 or greater mins in standing without rest break.   Time 8   Period Weeks   Status On-going   OT LONG TERM GOAL #3   Title Pt will demonstrate ability to write a short paragraph in a reasonable amount of time with 100% legibility.   Time 8   Period Weeks   Status Achieved   OT LONG TERM GOAL #4   Title Pt will demonstrate ability to follow instructions to complete a cooking task, demonstrating good safety awareness with supervision.   Time 8   Period Weeks   Status On-going   OT LONG TERM GOAL #5   Title Pt will demonstrate ability to lift and carry 25 lbs x 50 feet x3 reps in prep for work activities.   Time 8   Period Weeks   Status Achieved   OT LONG TERM GOAL #6   Title Assess grip strength and set goal if  needed   Time 8   Period Weeks   Status Achieved  baseline R= 80 left = 85 pt right handed   OT LONG TERM GOAL #7   Title Pt will demonstrate increased grip strength by at least 20 pounds in R dominant hand to aid in functional tasks (baseline R= 80, left = 85)   Status Achieved  met intiial goal 95 pounds on 11/08/2014 however pt reports he feels it is still a little weak compared to prior to stroke               Plan - 11/28/14 1745    Clinical Impression Statement Pt is progressing towards goals. He hopes to return to work mid August. He continues to demonstrate mild cognitive deficits, with decreased attention to detail.   Pt will benefit from skilled therapeutic intervention in order to improve on the following deficits (Retired) Abnormal gait;Decreased coordination;Decreased endurance;Decreased safety awareness;Decreased activity tolerance;Decreased balance;Decreased  cognition;Decreased mobility;Decreased strength;Impaired perceived functional ability;Impaired UE functional use   Rehab Potential Good   OT Frequency 2x / week   OT Duration 8 weeks   OT Treatment/Interventions Moist Heat;Fluidtherapy;DME and/or AE instruction;Patient/family education;Balance training;Therapeutic exercises;Ultrasound;Therapeutic exercise;Therapeutic activities;Cognitive remediation/compensation;Passive range of motion;Functional Mobility Training;Neuromuscular education;Cryotherapy;Parrafin;Energy conservation;Manual Therapy;Visual/perceptual remediation/compensation;Self-care/ADL training   Plan activitiy tolerance, cognition in a functional context   Consulted and Agree with Plan of Care  Patient;Family member/caregiver   Family Member Consulted wife           G-Codes - 11/29/14 1002    Functional Assessment Tool Used 9 hole peg test: RUE 25.53, supervision for cooking, Pt is performing basic ADLS modified independently   Functional Limitation Self care   Self Care Current Status (G8987) At least 20 percent but less than 40 percent impaired, limited or restricted   Self Care Goal Status (G8988) At least 1 percent but less than 20 percent impaired, limited or restricted      Problem List Patient Active Problem List   Diagnosis Date Noted  . Essential hypertension 10/17/2014  . Obstructive hydrocephalus   . Cerebellar infarct 10/11/2014  .Occupational Therapy Progress Note  Dates of Reporting Period: 10/23/14 to 11/28/14  Objective Reports of Subjective Statement:Pt met all short term goals  Objective Measurements: 9 hole peg test :25.53 secs  Goal Update: Pt met all short term goals. He demonstrates progress towards long term goals, and has achieved 5/7 long term goals. RUE grip strength is now 95#.   Plan: Continue to work towards unmet goals. Pt may benefit from PT for work hardening and FCE prior to return to Work.  Reason Skilled Services are Required: Pt  requires continued skilled OT to address mild cognitive deficits, decreased strength and endurance in order for pt to transition back to work.  RINE,KATHRYN 11/29/2014, 10:04 AM Kathryn Rine, OTR/L [phone] Phone: [phone] 10:04 AM 11/29/2014 [facility] [address] [place], [place], [address] Phone: [phone]   Fax:  [phone]    

## 2014-11-29 NOTE — Patient Instructions (Signed)
Homework provided 

## 2014-11-29 NOTE — Therapy (Signed)
[facility] [address] [place], [place], [address] Phone: [phone]   Fax:  [phone]  Speech Language Pathology Treatment  Patient Details  Name: George Armstrong MRN: [id number] Date of Birth: [birth year] Referring Provider:  [name], MD  Encounter Date: 11/29/2014      End of Session - 11/29/14 1455    Visit Number 6   Number of Visits 16   Date for SLP Re-Evaluation 01/08/15   SLP Start Time 1317   SLP Stop Time  1358   SLP Time Calculation (min) 41 min      Past Medical History  Diagnosis Date  . Diabetes mellitus without complication   . Hypertension     Past Surgical History  Procedure Laterality Date  . Cholecystectomy    . Ep implantable device N/A 10/17/2014    Procedure: Loop Recorder Insertion;  Surgeon: Duke Salvia, MD;  Location: [facility] INVASIVE CV LAB;  Service: Cardiovascular;  Laterality: N/A;  . Tee without cardioversion N/A 10/17/2014    Procedure: TRANSESOPHAGEAL ECHOCARDIOGRAM (TEE);  Surgeon: Thurmon Fair, MD;  Location: [facility] ENDOSCOPY;  Service: Cardiovascular;  Laterality: N/A;    There were no vitals filed for this visit.  Visit Diagnosis: Cognitive communication deficit  Aphasia      Subjective Assessment - 11/29/14 1325    Subjective I'm talking quicker"   Patient is accompained by: Family member               ADULT SLP TREATMENT - 11/29/14 1327    General Information   Behavior/Cognition Alert;Cooperative;Pleasant mood   Treatment Provided   Treatment provided Cognitive-Linquistic   Pain Assessment   Pain Assessment No/denies pain   Cognitive-Linquistic Treatment   Treatment focused on Cognition   Skilled Treatment Facilitated word finding describing 3 similarities and differences for similiar  items with  usual min to mod questioning cues. Divided attention facilitated with card game requiring pt to attend to 3 piles of cards at a time each with  different rules with usual min to mod verbal cues to attend to cards in his hands, draw pile and 3 card piles each turn.   Assessment / Recommendations / Plan   Plan Continue with current plan of care;Goals updated   Progression Toward Goals   Progression toward goals Progressing toward goals          SLP Education - 11/29/14 1453    Education provided Yes   Education Details compensations for aphasia, attention goals   Person(s) Educated Patient;Spouse   Methods Explanation;Demonstration;Verbal cues   Comprehension Verbalized understanding;Verbal cues required;Returned demonstration          SLP Short Term Goals - 11/29/14 1455    SLP SHORT TERM GOAL #1   Title pt will name 8 items in non-concrete category, average   Time 3   Period Weeks   Status On-going   SLP SHORT TERM GOAL #2   Title pt will demo >15 minutes of mod complex conversation using compensatory strategies in 90% of necessary opportunities   Time 3   Period Weeks   Status On-going   SLP SHORT TERM GOAL #3   Title Pt will report appropriate independent use of medication administration system of his choosing for one week    Time 3   Period Weeks   Status New   SLP SHORT TERM GOAL #4   Title Pt will demo appropriate divided attention skills in simple cognitive-linguistic tasks 60% of the time  Time 2   Period Weeks   Status New          SLP Long Term Goals - 11/29/14 1455    SLP LONG TERM GOAL #1   Title pt will demo >20 minutes of mod complex conversation with use of compensations at 95% success, over three sessions   Time 6   Period Weeks   Status On-going   SLP LONG TERM GOAL #2   Title pt will demo appropriate medication administration for two weeks using the system of his own choosing   Time 6   Period Weeks   Status On-going   SLP LONG TERM GOAL #3   Title pt will demo appropriate divided attention in simple cognitive-linguistic tasks with 80% success   Time 6   Period Weeks   Status  New          Plan - 11/29/14 1453    Clinical Impression Statement Pt required usual min to mod A for verbal expression of descriptions, similarities and differences. Pt required usual verbal cues for divided attention tasks, Continue skilled ST to maximize cogtive linguistic skills for possibl e return to work.        Problem List Patient Active Problem List   Diagnosis Date Noted  . Essential hypertension 10/17/2014  . Obstructive hydrocephalus   . Cerebellar infarct 10/11/2014    Jesika Men, Radene JourneyLaura Ann MS, CCC-SLP 11/29/2014, 2:56 PM  [facility] [address] [place], [place], [address] Phone: [phone]   Fax:  [phone]

## 2014-11-30 ENCOUNTER — Encounter

## 2014-11-30 DIAGNOSIS — R4701 Aphasia: Secondary | ICD-10-CM | POA: Diagnosis not present

## 2014-11-30 NOTE — Therapy (Signed)
[place] [address] [place], [place], [address] Phone: [phone]   Fax:  [phone]  Occupational Therapy Treatment  Patient Details  Name: George Armstrong MRN: [id number] Date of Birth: 26-Jan-1961 Referring Provider:  Dion Body, MD  Encounter Date: 11/29/2014      OT End of Session - 11/30/14 0841    Visit Number 11   Number of Visits 17   Date for OT Re-Evaluation 12/21/14   Authorization - Visit Number 11   Authorization - Number of Visits 20   OT Start Time 3845   OT Stop Time 1610   OT Time Calculation (min) 35 min   Activity Tolerance Patient tolerated treatment well   Behavior During Therapy [facility] for tasks assessed/performed      Past Medical History  Diagnosis Date  . Diabetes mellitus without complication   . Hypertension     Past Surgical History  Procedure Laterality Date  . Cholecystectomy    . Ep implantable device N/A 10/17/2014    Procedure: Loop Recorder Insertion;  Surgeon: Deboraha Sprang, MD;  Location: [facility] CV LAB;  Service: Cardiovascular;  Laterality: N/A;  . Tee without cardioversion N/A 10/17/2014    Procedure: TRANSESOPHAGEAL ECHOCARDIOGRAM (TEE);  Surgeon: Sanda Klein, MD;  Location: [facility] ENDOSCOPY;  Service: Cardiovascular;  Laterality: N/A;    There were no vitals filed for this visit.  Visit Diagnosis:  Cognitive deficits following cerebral infarction  Decreased functional activity tolerance  Weakness due to cerebrovascular accident      Subjective Assessment - 11/30/14 0837    Subjective  P/ wife agree with plans to  discharge today.   Patient is accompained by: Family member   Pertinent History see Epic snapshot   Patient Stated Goals to ride lawnmower   Currently in Pain? No/denies           Treatment: Therapist checked progress towards goals. Pt carried 25 lbs, 25 ft, then lifted overhead to shelf 5 reps, then floor to chair height 10  reps. Divided attention task, tossing ball ambulating  While performing category generation with grossly 80% accuracy. Pt agrees with plans for d/c                     OT Short Term Goals - 11/29/14 1544    OT SHORT TERM GOAL #1   Title I with initial HEP - 11/20/2014   Status Achieved   OT SHORT TERM GOAL #2   Title Pt will demonstrate improved RUE fine motor coordination as evidenced by performing 9 hole peg test in 38 secs or less.   Status Achieved   OT SHORT TERM GOAL #3   Title Pt will demonstrate improved activity tolerance as evidenced by performing functional activity in standing x 15 mins prior to rest break.   Status Achieved   OT SHORT TERM GOAL #4   Title Pt will demonstrate ability to follow directions for a simple functional or cooking task with min A.   Status Achieved   OT SHORT TERM GOAL #5   Title Pt will demonstrate ability to place/ retrieve 5 lbs on overhead shelf  x 5 reps each UE without drops   Status Achieved   OT SHORT TERM GOAL #6   Title Pt will demonstrate ability to perform a physical and cognitive task simultaneously with 80% accuracy   Status Achieved           OT Long Term Goals - 11/29/14  [name] #1   Title Pt will perform all basic ADLS modified independently - 12/18/2014   Time 8   Period Weeks   Status Achieved   OT LONG TERM GOAL #2   Title Pt will perform functional activity/ home management x 30-45 or greater mins in standing without rest break.   Time 8   Period Weeks   Status Achieved   OT LONG TERM GOAL #3   Title Pt will demonstrate ability to write a short paragraph in a reasonable amount of time with 100% legibility.   Time 8   Period Weeks   Status Achieved   OT LONG TERM GOAL #4   Title Pt will demonstrate ability to follow instructions to complete a cooking task, demonstrating good safety awareness with supervision.   Baseline Performs with supervision, min v.c. to use a longer spoon for  stirring pot.   Time 8   Period Weeks   Status Partially Met   OT LONG TERM GOAL #5   Title Pt will demonstrate ability to lift and carry 25 lbs x 50 feet x3 reps in prep for work activities.   Time 8   Period Weeks   Status Achieved   OT LONG TERM GOAL #6   Title Assess grip strength and set goal if  needed   Time 8   Period Weeks   Status Achieved  baseline R= 80 left = 85 pt right handed   OT LONG TERM GOAL #7   Title Pt will demonstrate increased grip strength by at least 20 pounds in R dominant hand to aid in functional tasks (baseline R= 80, left = 85)   Status Achieved  met intiial goal 95 pounds on 11/08/2014 however pt reports he feels it is still a little weak compared to prior to stroke               Plan - 2014/12/20 7169    Clinical Impression Statement Pt/ wife agree with plans to discharge from therapy today. Therapist to request order  for PT to address work hardening/ possible FCE.   Pt will benefit from skilled therapeutic intervention in order to improve on the following deficits (Retired) Abnormal gait;Decreased coordination;Decreased endurance;Decreased safety awareness;Decreased activity tolerance;Decreased balance;Decreased cognition;Decreased mobility;Decreased strength;Impaired perceived functional ability;Impaired UE functional use   Rehab Potential Good   OT Frequency 2x / week   OT Duration 8 weeks   OT Treatment/Interventions Moist Heat;Fluidtherapy;DME and/or AE instruction;Patient/family education;Balance training;Therapeutic exercises;Ultrasound;Therapeutic exercise;Therapeutic activities;Cognitive remediation/compensation;Passive range of motion;Functional Mobility Training;Neuromuscular education;Cryotherapy;Parrafin;Energy conservation;Manual Therapy;Visual/perceptual remediation/compensation;Self-care/ADL training   Plan discharge OT, therapist to request PT order for work hardening.   Consulted and Agree with Plan of Care Patient;Family  member/caregiver          G-Codes - Dec 20, 2014 0843    Functional Assessment Tool Used supervision for cooking, demonstrates ability to lift 25 lbs overhead x 5 reps, lifts 25 lbs from floor 10x without drops, 7/8 goals met   Functional Limitation Self care   Self Care Goal Status (C7893) At least 1 percent but less than 20 percent impaired, limited or restricted   Self Care Discharge Status [phone]) At least 1 percent but less than 20 percent impaired, limited or restricted      Problem List Patient Active Problem List   Diagnosis Date Noted  . Essential hypertension 10/17/2014  . Obstructive hydrocephalus   . Cerebellar infarct 10/11/2014  OCCUPATIONAL THERAPY DISCHARGE SUMMARY Current functional level related to  goals / functional outcomes: 7/8 long term goals met, Pt demonstrates excellent progress overall. Pt's wife will continue to provided supervision/ feed back for cooking tasks.   Remaining deficits: Mild cognitive deficits, decreased strength and endurance  Education / Equipment: Pt/wife were educated re: HEP, safety for cooking tasks and continued mild cognitive changes. Pt/ wife agress with d/c the patient hopes to transition to PT at [employer]. for work hardening. Plan: Patient agrees to discharge.  Patient goals were partially met. Patient is being discharged due to the patient's request.  ?????      Traniyah Hallett 11/30/2014, 4:19 PM Theone Murdoch, OTR/L [phone] Phone: [phone] 4:19 PM 11/30/2014 [facility] [address] [place], [place], [address] Phone: [phone]   Fax:  [phone]

## 2014-12-01 ENCOUNTER — Other Ambulatory Visit: Payer: Self-pay | Admitting: Neurology

## 2014-12-01 DIAGNOSIS — I639 Cerebral infarction, unspecified: Secondary | ICD-10-CM

## 2014-12-05 ENCOUNTER — Encounter: Admitting: Occupational Therapy

## 2014-12-05 ENCOUNTER — Ambulatory Visit

## 2014-12-05 DIAGNOSIS — R41841 Cognitive communication deficit: Secondary | ICD-10-CM

## 2014-12-05 DIAGNOSIS — R4701 Aphasia: Secondary | ICD-10-CM | POA: Diagnosis not present

## 2014-12-05 NOTE — Patient Instructions (Signed)
  Please complete the assigned speech therapy homework prior to your next session.  

## 2014-12-05 NOTE — Therapy (Signed)
[facility] [address] [place], [place], [address] Phone: [phone]   Fax:  [phone]  Speech Language Pathology Treatment  Patient Details  Name: George Armstrong MRN: [id number] Date of Birth: 07/24/1960 Referring Provider:  Marisue IvanLinthavong, [name], MD  Encounter Date: 12/05/2014      End of Session - 12/05/14 1612    Visit Number 7   Number of Visits 16   Date for SLP Re-Evaluation 01/08/15   SLP Start Time 1536   SLP Stop Time  1617   SLP Time Calculation (min) 41 min   Activity Tolerance Patient tolerated treatment well      Past Medical History  Diagnosis Date  . Diabetes mellitus without complication   . Hypertension     Past Surgical History  Procedure Laterality Date  . Cholecystectomy    . Ep implantable device N/A 10/17/2014    Procedure: Loop Recorder Insertion;  Surgeon: Duke SalviaSteven C Klein, MD;  [facility];  Service: Cardiovascular;  Laterality: N/A;  . Tee without cardioversion N/A 10/17/2014    Procedure: TRANSESOPHAGEAL ECHOCARDIOGRAM (TEE);  Surgeon: Thurmon FairMihai Croitoru, MD;  [facility];  Service: Cardiovascular;  Laterality: N/A;    There were no vitals filed for this visit.  Visit Diagnosis: Cognitive communication deficit      Subjective Assessment - 12/05/14 1541    Subjective "I had company over the weekend so I didn't do much of anything (homework)."   Patient is accompained by: Family member  son, wife               ADULT SLP TREATMENT - 12/05/14 1544    General Information   Behavior/Cognition Alert;Cooperative;Pleasant mood   Treatment Provided   Treatment provided Cognitive-Linquistic   Pain Assessment   Pain Assessment No/denies pain   Cognitive-Linquistic Treatment   Treatment focused on Cognition   Skilled Treatment Divided attention tasks with written and auditory stimuli (word problems and tracking commercials) with occasional min  nonverbal cues from SLP. Pt skipped questions on written tasks requiring SLP cues. SLP req'd to give cues to return to written task during interim of commercials.   Assessment / Recommendations / Plan   Plan Continue with current plan of care   Progression Toward Goals   Progression toward goals Progressing toward goals            SLP Short Term Goals - 12/05/14 1618    SLP SHORT TERM GOAL #1   Title pt will name 8 items in non-concrete category, average   Time --   Period --   Status Achieved   SLP SHORT TERM GOAL #2   Title pt will demo >15 minutes of mod complex conversation using compensatory strategies in 90% of necessary opportunities   Time --   Period --   Status Achieved   SLP SHORT TERM GOAL #3   Title Pt will report appropriate independent use of medication administration system of his choosing for one week    Time 1   Period Weeks   Status On-going   SLP SHORT TERM GOAL #4   Title Pt will demo appropriate divided attention skills in simple cognitive-linguistic tasks 60% of the time   Time 1   Period Weeks   Status On-going          SLP Long Term Goals - 12/05/14 1623    SLP LONG TERM GOAL #1   Title pt will demo >20 minutes of mod complex conversation  with use of compensations at 95% success, over three sessions   Status Achieved   SLP LONG TERM GOAL #2   Title pt will demo appropriate medication administration for two weeks using the system of his own choosing   Time 5   Period Weeks   Status On-going   SLP LONG TERM GOAL #3   Title pt will demo appropriate divided attention in simple cognitive-linguistic tasks with 90% success over two sessions   Time 5   Period Weeks   Status Revised          Plan - 12/05/14 1612    Clinical Impression Statement Pt cont to require cues occasionally in divided attention tasks, faded to rare min A (auditory/written).    Speech Therapy Frequency 2x / week   Duration 1 week  5 weeks   Treatment/Interventions  Patient/family education;Compensatory strategies;Internal/external aids;SLP instruction and feedback;Functional tasks;Cueing hierarchy;Cognitive reorganization   Potential to Achieve Goals Good        Problem List Patient Active Problem List   Diagnosis Date Noted  . Essential hypertension 10/17/2014  . Obstructive hydrocephalus   . Cerebellar infarct 10/11/2014    [facility] , MS, CCC-SLP  12/05/2014, 4:30 PM  [place] [facility] [address] [place], [place], [address] Phone: [phone]   Fax:  [phone]

## 2014-12-07 ENCOUNTER — Encounter: Admitting: Occupational Therapy

## 2014-12-07 ENCOUNTER — Ambulatory Visit

## 2014-12-07 DIAGNOSIS — R41841 Cognitive communication deficit: Secondary | ICD-10-CM

## 2014-12-07 DIAGNOSIS — R4701 Aphasia: Secondary | ICD-10-CM | POA: Diagnosis not present

## 2014-12-07 NOTE — Therapy (Signed)
[facility] [address] [place], [place], [address] Phone: [phone]   Fax:  [phone]  Speech Language Pathology Treatment  Patient Details  Name: George Armstrong MRN: [id number] Date of Birth: 09/30/1960 Referring Provider:  [name], MD  Encounter Date: 12/07/2014      End of Session - 12/07/14 1655    Visit Number 8   Number of Visits 16   Date for SLP Re-Evaluation 01/08/15   SLP Start Time 1405   SLP Stop Time  1445   SLP Time Calculation (min) 40 min   Activity Tolerance Patient tolerated treatment well      Past Medical History  Diagnosis Date  . Diabetes mellitus without complication   . Hypertension     Past Surgical History  Procedure Laterality Date  . Cholecystectomy    . Ep implantable device N/A 10/17/2014    Procedure: Loop Recorder Insertion;  Surgeon: Duke Salvia, MD;  Location: [facility] INVASIVE CV LAB;  Service: Cardiovascular;  Laterality: N/A;  . Tee without cardioversion N/A 10/17/2014    Procedure: TRANSESOPHAGEAL ECHOCARDIOGRAM (TEE);  Surgeon: Thurmon Fair, MD;  Location: [facility] ENDOSCOPY;  Service: Cardiovascular;  Laterality: N/A;    There were no vitals filed for this visit.  Visit Diagnosis: Cognitive communication deficit      Subjective Assessment - 12/07/14 1414    Patient is accompained by: Family member  wife               ADULT SLP TREATMENT - 12/07/14 1414    General Information   Behavior/Cognition Alert;Cooperative;Pleasant mood   Treatment Provided   Treatment provided Cognitive-Linquistic   Pain Assessment   Pain Assessment No/denies pain   Cognitive-Linquistic Treatment   Treatment focused on Cognition   Skilled Treatment SLP facilitated cognitive linguistic skill practice today by divided attention tasks (written and auditory) with rare mod cues from SLP, and slow progress on written task believed due to pt's intense focus on auditory  task. Pt remarked he has not been completing any homework for attention at home and SLP educated pt on rationale behind copmleting homework.    Assessment / Recommendations / Plan   Plan Continue with current plan of care   Progression Toward Goals   Progression toward goals Not progressing toward goals (comment)  SLP discussed with pt today the need to do ST homework          SLP Education - 12/07/14 1655    Education provided Yes   Education Details rationale for completing homework assigned by SLP   Person(s) Educated Patient;Spouse   Methods Explanation   Comprehension Verbalized understanding          SLP Short Term Goals - 12/07/14 1657    SLP SHORT TERM GOAL #1   Title pt will name 8 items in non-concrete category, average   Status Achieved   SLP SHORT TERM GOAL #2   Title pt will demo >15 minutes of mod complex conversation using compensatory strategies in 90% of necessary opportunities   Status Achieved   SLP SHORT TERM GOAL #3   Title Pt will report appropriate independent use of medication administration system of his choosing for one week    Status Deferred  due to practice on attention   SLP SHORT TERM GOAL #4   Title Pt will demo appropriate divided attention skills in simple cognitive-linguistic tasks 60% of the time   Status Achieved  SLP Long Term Goals - 12/07/14 1658    SLP LONG TERM GOAL #1   Title pt will demo >20 minutes of mod complex conversation with use of compensations at 95% success, over three sessions   Status Achieved   SLP LONG TERM GOAL #2   Title pt will demo appropriate medication administration for two weeks using the system of his own choosing   Time 5   Period Weeks   Status On-going   SLP LONG TERM GOAL #3   Title pt will demo appropriate divided attention in simple cognitive-linguistic tasks with 90% success over two sessions   Time 5   Period Weeks   Status Revised          Plan - 12/07/14 1656    Clinical  Impression Statement Pt required mod cues occasionally in divided attention tasks. Pt's cueing level incr'd today. SLP reiterated to pt he must complete homework for attention at home.   Speech Therapy Frequency 2x / week   Duration --  5 weeks   Treatment/Interventions Patient/family education;Compensatory strategies;Internal/external aids;SLP instruction and feedback;Functional tasks;Cueing hierarchy;Cognitive reorganization   Potential to Achieve Goals Good   Potential Considerations Cooperation/participation level        Problem List Patient Active Problem List   Diagnosis Date Noted  . Essential hypertension 10/17/2014  . Obstructive hydrocephalus   . Cerebellar infarct 10/11/2014    [facility] , MS, CCC-SLP  12/07/2014, 4:59 PM  [facility] [address] [place], [place], [address] Phone: [phone]   Fax:  [phone]

## 2014-12-07 NOTE — Patient Instructions (Signed)
You must do homework tasks to give yourself the best opportunity for your brain to heal.  Complete divided attention tasks at home at least twice a day, for 15-20 minutes each.

## 2014-12-10 ENCOUNTER — Encounter: Admitting: Occupational Therapy

## 2014-12-11 ENCOUNTER — Encounter: Admitting: Occupational Therapy

## 2014-12-12 ENCOUNTER — Ambulatory Visit

## 2014-12-12 DIAGNOSIS — R41841 Cognitive communication deficit: Secondary | ICD-10-CM

## 2014-12-12 DIAGNOSIS — R4701 Aphasia: Secondary | ICD-10-CM | POA: Diagnosis not present

## 2014-12-12 NOTE — Therapy (Signed)
[facility] [address] [place], [place], [address] Phone: [phone]   Fax:  [phone]  Speech Language Pathology Treatment  Patient Details  Name: George Armstrong MRN: [id number] Date of Birth: 1960-06-20 Referring Provider:  [name], MD  Encounter Date: 12/12/2014      End of Session - 12/12/14 1630    Authorization Type NEEDS g-code      Past Medical History  Diagnosis Date  . Diabetes mellitus without complication   . Hypertension     Past Surgical History  Procedure Laterality Date  . Cholecystectomy    . Ep implantable device N/A 10/17/2014    Procedure: Loop Recorder Insertion;  Surgeon: Duke Salvia, MD;  Location: [facility] INVASIVE CV LAB;  Service: Cardiovascular;  Laterality: N/A;  . Tee without cardioversion N/A 10/17/2014    Procedure: TRANSESOPHAGEAL ECHOCARDIOGRAM (TEE);  Surgeon: Thurmon Fair, MD;  Location: [facility] ENDOSCOPY;  Service: Cardiovascular;  Laterality: N/A;    There were no vitals filed for this visit.  Visit Diagnosis: Cognitive communication deficit             ADULT SLP TREATMENT - 12/12/14 0902    General Information   Behavior/Cognition Alert;Cooperative;Pleasant mood   Treatment Provided   Treatment provided Cognitive-Linquistic   Pain Assessment   Pain Assessment No/denies pain   Cognitive-Linquistic Treatment   Treatment focused on Cognition   Skilled Treatment SLP facilitated conversation re: medication administration. Pt is now modified independent with this task, requiring reminder to take med at 6pm. SLP targeted divided attention in simple tasks (crossword-written task, and simple change/spelling verbal task) with 37% divided attention and 63% alternating. Pt with 88% accuracy with verbal tasks. SLP ensured pt was practicing divided attention at home and he is doing crosswords as well as answering simple questions from wife. SLP encouraged pt to cont  with this and explained rationale again to pt/family.   Assessment / Recommendations / Plan   Plan Continue with current plan of care   Progression Toward Goals   Progression toward goals Progressing toward goals          SLP Education - 12/12/14 0934    Education provided Yes   Education Details rationale for divided attention tasks at home   Person(s) Educated Patient;Child(ren);Spouse   Methods Explanation   Comprehension Verbalized understanding          SLP Short Term Goals - 12/07/14 1657    SLP SHORT TERM GOAL #1   Title pt will name 8 items in non-concrete category, average   Status Achieved   SLP SHORT TERM GOAL #2   Title pt will demo >15 minutes of mod complex conversation using compensatory strategies in 90% of necessary opportunities   Status Achieved   SLP SHORT TERM GOAL #3   Title Pt will report appropriate independent use of medication administration system of his choosing for one week    Status Deferred  due to practice on attention   SLP SHORT TERM GOAL #4   Title Pt will demo appropriate divided attention skills in simple cognitive-linguistic tasks 60% of the time   Status Achieved          SLP Long Term Goals - 12/12/14 0859    SLP LONG TERM GOAL #1   Title pt will demo >20 minutes of mod complex conversation with use of compensations at 95% success, over three sessions   Status Achieved   SLP LONG TERM GOAL #2   Title  pt will demo appropriate medication administration for two weeks using the system of his own choosing   Status Achieved   SLP LONG TERM GOAL #3   Title pt will demo appropriate divided attention in simple cognitive-linguistic tasks with 90% success over two sessions   Time 4   Period Weeks   Status Revised          Plan - 12/19/2014 0934    Clinical Impression Statement Pt required mod cues occasionally in divided attention tasks. SLP reiterated to pt he must complete homework for attention at home and explained rationale  why.   Speech Therapy Frequency 2x / week   Duration 4 weeks   Treatment/Interventions Patient/family education;Compensatory strategies;Internal/external aids;SLP instruction and feedback;Functional tasks;Cueing hierarchy;Cognitive reorganization   Potential to Achieve Goals Good   Potential Considerations Severity of impairments          G-Codes - 12/19/2014 1630    Functional Limitations Spoken language expressive   Spoken Language Expression Goal Status [phone]) At least 1 percent but less than 20 percent impaired, limited or restricted   Spoken Language Expression Discharge Status [phone]) At least 1 percent but less than 20 percent impaired, limited or restricted      Problem List Patient Active Problem List   Diagnosis Date Noted  . Essential hypertension 10/17/2014  . Obstructive hydrocephalus   . 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

## 2014-12-13 ENCOUNTER — Encounter: Admitting: Occupational Therapy

## 2014-12-13 ENCOUNTER — Ambulatory Visit: Admitting: Physical Therapy

## 2014-12-14 ENCOUNTER — Encounter

## 2014-12-17 ENCOUNTER — Ambulatory Visit (INDEPENDENT_AMBULATORY_CARE_PROVIDER_SITE_OTHER): Admitting: *Deleted

## 2014-12-17 DIAGNOSIS — I639 Cerebral infarction, unspecified: Secondary | ICD-10-CM

## 2014-12-19 NOTE — Progress Notes (Signed)
Loop recorder 

## 2014-12-21 ENCOUNTER — Encounter: Payer: Self-pay | Admitting: Internal Medicine

## 2014-12-26 ENCOUNTER — Ambulatory Visit

## 2014-12-28 ENCOUNTER — Ambulatory Visit

## 2014-12-31 LAB — CUP PACEART REMOTE DEVICE CHECK: Date Time Interrogation Session: 20160815083028

## 2015-01-01 ENCOUNTER — Ambulatory Visit (INDEPENDENT_AMBULATORY_CARE_PROVIDER_SITE_OTHER): Payer: TRICARE For Life (TFL) | Admitting: Neurology

## 2015-01-01 ENCOUNTER — Encounter: Payer: Self-pay | Admitting: Neurology

## 2015-01-01 VITALS — BP 140/70 | HR 70 | Ht 67.0 in | Wt 212.4 lb

## 2015-01-01 DIAGNOSIS — I639 Cerebral infarction, unspecified: Secondary | ICD-10-CM | POA: Diagnosis not present

## 2015-01-01 NOTE — Patient Instructions (Addendum)
I had a long d/w patient, wife and son about his recent stroke, risk for recurrent stroke/TIAs, personally independently reviewed imaging studies and stroke evaluation results and answered questions.Continue aspirin 325 mg orally every day  for secondary stroke prevention and maintain strict control of hypertension with blood pressure goal below 130/90, diabetes with hemoglobin A1c goal below 6.5% and lipids with LDL cholesterol goal below 100 mg/dL. He was also advised to be compliant with using his CPAP for sleep apnea I also advised the patient to eat a healthy diet with plenty of whole grains, cereals, fruits and vegetables, exercise regularly and maintain ideal body weight .I also discussed with the patient and wife possible participation in the RESPECT ESUS trial if interested. He will be given written information to review at home and will call us if he wants to participate. Patient was advised to return back to work initially 3 days a week for 2 weeks and if tolerated back to full-time Followup in the future with me in 6 months or call earlier if necessary.  Stroke Prevention Some medical conditions and behaviors are associated with an increased chance of having a stroke. You may prevent a stroke by making healthy choices and managing medical conditions. HOW CAN I REDUCE MY RISK OF HAVING A STROKE?   Stay physically active. Get at least 30 minutes of activity on most or all days.  Do not smoke. It may also be helpful to avoid exposure to secondhand smoke.  Limit alcohol use. Moderate alcohol use is considered to be:  No more than 2 drinks per day for men.  No more than 1 drink per day for nonpregnant women.  Eat healthy foods. This involves:  Eating 5 or more servings of fruits and vegetables a day.  Making dietary changes that address high blood pressure (hypertension), high cholesterol, diabetes, or obesity.  Manage your cholesterol levels.  Making food choices that are high in  fiber and low in saturated fat, trans fat, and cholesterol may control cholesterol levels.  Take any prescribed medicines to control cholesterol as directed by your health care provider.  Manage your diabetes.  Controlling your carbohydrate and sugar intake is recommended to manage diabetes.  Take any prescribed medicines to control diabetes as directed by your health care provider.  Control your hypertension.  Making food choices that are low in salt (sodium), saturated fat, trans fat, and cholesterol is recommended to manage hypertension.  Take any prescribed medicines to control hypertension as directed by your health care provider.  Maintain a healthy weight.  Reducing calorie intake and making food choices that are low in sodium, saturated fat, trans fat, and cholesterol are recommended to manage weight.  Stop drug abuse.  Avoid taking birth control pills.  Talk to your health care provider about the risks of taking birth control pills if you are over 19 years old, smoke, get migraines, or have ever had a blood clot.  Get evaluated for sleep disorders (sleep apnea).  Talk to your health care provider about getting a sleep evaluation if you snore a lot or have excessive sleepiness.  Take medicines only as directed by your health care provider.  For some people, aspirin or blood thinners (anticoagulants) are helpful in reducing the risk of forming abnormal blood clots that can lead to stroke. If you have the irregular heart rhythm of atrial fibrillation, you should be on a blood thinner unless there is a good reason you cannot take them.  Understand all your medicine  instructions.  Make sure that other conditions (such as anemia or atherosclerosis) are addressed. SEEK IMMEDIATE MEDICAL CARE IF:   You have sudden weakness or numbness of the face, arm, or leg, especially on one side of the body.  Your face or eyelid droops to one side.  You have sudden confusion.  You  have trouble speaking (aphasia) or understanding.  You have sudden trouble seeing in one or both eyes.  You have sudden trouble walking.  You have dizziness.  You have a loss of balance or coordination.  You have a sudden, severe headache with no known cause.  You have new chest pain or an irregular heartbeat. Any of these symptoms may represent a serious problem that is an emergency. Do not wait to see if the symptoms will go away. Get medical help at once. Call your local emergency services (911 in [place] Do not drive yourself to the hospital. Document Released: 06/11/2004 Document Revised: 09/18/2013 Document Reviewed: 11/04/2012 [facility] Patient Information 2015 [place], [place]. This information is not intended to replace advice given to you by your health care provider. Make sure you discuss any questions you have with your health care provider.

## 2015-01-01 NOTE — Progress Notes (Signed)
[facility] [address] [place]. [place] [address] [phone]       OFFICE FOLLOW-UP NOTE  George Armstrong Date of Birth:  21-Jan-1961 Medical Record Number:  [id number]   HPI: George Armstrong is an 54 y.o. male who was noted by his wife to start having trouble with his vision on 10/04/14 Thursday evening. He made a comment that he needed to change the lens on his glasses. Had no other complaints that day. On Friday had complaints of dizziness. This was intermittent and by night time went to bed without further complaints. On Saturday complained of a headache that was intermittent. On Sunday the patient had no complaints. On Monday at 0900 became severely dizzy and was having some trouble getting around. He was taken to [facility]. They did a scan that was unremarkable and treated his headache. The patient was very lethargic after his headache treatment and was sent home. He remained lethargic and slept all day on Tuesday. Went to his primary again on Wednesday 10/10/14 and was referred back to [facility] for further evaluation. BP elevated on arrival. CT head on admission 10/10/14 showed  extensive abnormal attenuation throughout the mid and upper cerebellar hemispheres with relative compression of the fourth ventricle. Early enlargement of the lateral and third ventricles.CT angiogram head and brain showed Extensive acute infarct in the superior cerebellum bilaterally with mass-effect and compression of the fourth ventricle.Bilateral carotid and vertebral arteries widely patent in the neck. No significant intracranial stenosis or mass vascular malformation. 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 MRI brain showed 1.Extensive patchy areas of acute/subacute infarction involving the posterior circulation. This is most prominent within the superior aspect of the cerebellum bilaterally but also involves the posterior left pons, left  thalamus, posterior medial bilateral parietal lobes, and left occipital pole. 2. T2 changes and leptomeningeal enhancement suggest a subacute time frame. 3. There is some hemorrhagic conversion of the superior cerebellar infarcts with blood products noted. 4. Mild periventricular white matter changes are noted otherwise. 5. Mass effect on the cerebellum with partial effacement of the fourth ventricle and quadrigeminal plate cistern. There is no gross herniation.Mrv Head was normal.2D Echocardiogram showed LV  cavity size was normal. Wall thickness wasincreased in a pattern of mild LVH. Systolic function was normal.The estimated ejection fraction was in the range of 55% to 60%.Wall motion was normal; there were no regional wall motion abnormalities. TEE Mild LVH, otherwise normal echo.EKG and Telemetry monitoring showed sinus rhytmn. Hemoglobin A1c was elevated at 8.8. Total cholesterol is elevated 359, triglycerides 115, HDL 55 and LDL cholesterol 281 mg percent. He was started on Lipitor and aspirin. Hypercoagulable panel labs were all normal .Patient had loop recorder inserted to monitor for paroxysmal atrial fibrillation and so far it has not been detected. Patient states his done extremely well since discharge. He is tolerating aspirin well without bleeding or bruising as well as Lipitor without myalgias or arthralgias. He was started on metformin for diabetes and advised to follow-up with his primary physician  He has been using CPAP daily for his sleep apnea. He states he has no residual neurological deficits though he has to be slightly careful with his balance. His speech and dizziness are normal. He in fact wants a letter to go back to work.  ROS:   14 system review of systems is positive for  ringing in the ears, rash, snoring, cough, headache, change in appetite gait imbalance and all other systems negative  PMH:  Past Medical History  Diagnosis Date  . Diabetes mellitus without complication     . Hypertension     Social History:  Social History   Social History  . Marital Status: Married    Spouse Name: N/A  . Number of Children: N/A  . Years of Education: N/A   Occupational History  . Not on file.   Social History Main Topics  . Smoking status: Former Games developer  . Smokeless tobacco: Never Used  . Alcohol Use: Yes     Comment: 2 x / month  . Drug Use: Not on file  . Sexual Activity: Yes   Other Topics Concern  . Not on file   Social History Narrative    Medications:   Current Outpatient Prescriptions on File Prior to Visit  Medication Sig Dispense Refill  . aspirin 325 MG tablet Take 1 tablet (325 mg total) by mouth daily. 30 tablet 12  . hydrochlorothiazide (HYDRODIURIL) 25 MG tablet Take 25 mg by mouth daily.    . Hydrocodone-Acetaminophen 7.5-300 MG TABS Take 1 tablet by mouth every 4 (four) hours as needed (for pain).    Marland Kitchen ibuprofen (ADVIL,MOTRIN) 200 MG tablet Take 200 mg by mouth every 6 (six) hours as needed for headache.    . lisinopril (PRINIVIL,ZESTRIL) 20 MG tablet Take 1 tablet (20 mg total) by mouth daily. 30 tablet 2   No current facility-administered medications on file prior to visit.    Allergies:  No Known Allergies  Physical Exam General: Mildly obese middle-aged African-American male, seated, in no evident distress Head: head normocephalic and atraumatic.  Neck: supple with no carotid or supraclavicular bruits Cardiovascular: regular rate and rhythm, no murmurs Musculoskeletal: no deformity Skin:  no rash/petichiae Vascular:  Normal pulses all extremities Filed Vitals:   01/01/15 0928  BP: 140/70  Pulse: 70   Neurologic Exam Mental Status: Awake and fully alert. Oriented to place and time. Recent and remote memory intact. Attention span, concentration and fund of knowledge appropriate. Mood and affect appropriate.  Cranial Nerves: Fundoscopic exam reveals sharp disc margins. Pupils equal, briskly reactive to light. Extraocular  movements full without nystagmus. Visual fields full to confrontation. Hearing intact. Facial sensation intact. Face, tongue, palate moves normally and symmetrically.  Motor: Normal bulk and tone. Normal strength in all tested extremity muscles. Sensory.: intact to touch ,pinprick .position and vibratory sensation.  Coordination: Rapid alternating movements normal in all extremities. Finger-to-nose and heel-to-shin performed accurately bilaterally. Gait and Station: Arises from chair without difficulty. Stance is normal. Gait demonstrates normal stride length and balance . Unable to heel, toe and tandem walk without difficulty.  Reflexes: 1+ and symmetric. Toes downgoing.   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 Patient has done remarkably well with practically no deficits. Vascular risk factors of diabetes, hypertension, hyperlipidemia, mild opacity and sleep apnea    PLAN: I had a long d/w patient, wife and son about his recent stroke, risk for recurrent stroke/TIAs, personally independently reviewed imaging studies and stroke evaluation results and answered questions.Continue aspirin 325 mg orally every day  for secondary stroke prevention and maintain strict control of hypertension with blood pressure goal below 130/90, diabetes with hemoglobin A1c goal below 6.5% and lipids with LDL cholesterol goal below 100 mg/dL. He was also advised to be compliant with using his CPAP for sleep apnea I  also advised the patient to eat a healthy diet with plenty of whole grains, cereals, fruits and vegetables, exercise regularly and maintain ideal body weight .I also discussed with the patient and wife possible participation in the RESPECT ESUS trial if interested. He will be given written information to review at home and will call us if  he wants to participate. Patient was advised to return back to work initially 3 days a week for 2 weeks and if tolerated back to full-time Followup in the future with me in 6 months or call earlier if necessary. Delia Heady, MD   Note: This document was prepared with digital dictation and possible smart phrase technology. Any transcriptional errors that result from this process are unintentional

## 2015-01-02 ENCOUNTER — Telehealth: Payer: Self-pay | Admitting: Neurology

## 2015-01-02 ENCOUNTER — Telehealth: Payer: Self-pay

## 2015-01-02 NOTE — Telephone Encounter (Signed)
Pt saw Dr Pearlean Brownie yesterday and forgot to ask 1 question. Can he still use Viagra after having sroke?

## 2015-01-02 NOTE — Telephone Encounter (Signed)
Called subject to enquire about his interest in R ESus trial. He needs more time to think. I will call again on Friday.

## 2015-01-04 NOTE — Telephone Encounter (Signed)
I called the patient and answered his questions. He has decided not to participate in the stroke research trial

## 2015-01-08 ENCOUNTER — Ambulatory Visit

## 2015-01-08 NOTE — Telephone Encounter (Signed)
Dr Pearlean Brownie has talk to patent and address the viagra question.

## 2015-01-09 ENCOUNTER — Ambulatory Visit

## 2015-01-11 ENCOUNTER — Encounter

## 2015-01-15 ENCOUNTER — Ambulatory Visit (INDEPENDENT_AMBULATORY_CARE_PROVIDER_SITE_OTHER): Admitting: *Deleted

## 2015-01-15 DIAGNOSIS — I639 Cerebral infarction, unspecified: Secondary | ICD-10-CM

## 2015-01-16 ENCOUNTER — Encounter

## 2015-01-16 NOTE — Progress Notes (Signed)
Loop recorder 

## 2015-01-17 ENCOUNTER — Encounter

## 2015-01-25 LAB — CUP PACEART REMOTE DEVICE CHECK: MDC IDC SESS DTM: 20160909133314

## 2015-01-25 NOTE — Progress Notes (Signed)
Carelink summary report received. Battery status OK. Normal device function. No new symptom episodes, tachy episodes, brady, or pause episodes. No new AF episodes. Monthly summary reports and ROV with SK in 10/2015. 

## 2015-02-12 ENCOUNTER — Encounter (HOSPITAL_COMMUNITY): Payer: Self-pay | Admitting: *Deleted

## 2015-02-12 ENCOUNTER — Emergency Department (HOSPITAL_COMMUNITY)
Admission: EM | Admit: 2015-02-12 | Discharge: 2015-02-12 | Disposition: A | Discharge status: Home / Self Care | Attending: Emergency Medicine | Admitting: Emergency Medicine

## 2015-02-12 DIAGNOSIS — Z79899 Other long term (current) drug therapy: Secondary | ICD-10-CM | POA: Diagnosis not present

## 2015-02-12 DIAGNOSIS — R42 Dizziness and giddiness: Secondary | ICD-10-CM | POA: Diagnosis present

## 2015-02-12 DIAGNOSIS — R55 Syncope and collapse: Secondary | ICD-10-CM | POA: Diagnosis not present

## 2015-02-12 DIAGNOSIS — I10 Essential (primary) hypertension: Secondary | ICD-10-CM | POA: Diagnosis not present

## 2015-02-12 DIAGNOSIS — E119 Type 2 diabetes mellitus without complications: Secondary | ICD-10-CM | POA: Insufficient documentation

## 2015-02-12 DIAGNOSIS — Z87891 Personal history of nicotine dependence: Secondary | ICD-10-CM | POA: Diagnosis not present

## 2015-02-12 DIAGNOSIS — Z7982 Long term (current) use of aspirin: Secondary | ICD-10-CM | POA: Insufficient documentation

## 2015-02-12 DIAGNOSIS — Z8673 Personal history of transient ischemic attack (TIA), and cerebral infarction without residual deficits: Secondary | ICD-10-CM | POA: Insufficient documentation

## 2015-02-12 HISTORY — DX: Cerebral infarction, unspecified: I63.9

## 2015-02-12 LAB — CBC
HCT: 43.6 % (ref 39.0–52.0)
HEMOGLOBIN: 14.5 g/dL (ref 13.0–17.0)
MCH: 29.7 pg (ref 26.0–34.0)
MCHC: 33.3 g/dL (ref 30.0–36.0)
MCV: 89.2 fL (ref 78.0–100.0)
PLATELETS: 198 10*3/uL (ref 150–400)
RBC: 4.89 MIL/uL (ref 4.22–5.81)
RDW: 13.5 % (ref 11.5–15.5)
WBC: 6.7 10*3/uL (ref 4.0–10.5)

## 2015-02-12 LAB — BASIC METABOLIC PANEL
ANION GAP: 10 (ref 5–15)
BUN: 12 mg/dL (ref 6–20)
CALCIUM: 9.9 mg/dL (ref 8.9–10.3)
CO2: 29 mmol/L (ref 22–32)
CREATININE: 1.11 mg/dL (ref 0.61–1.24)
Chloride: 98 mmol/L — ABNORMAL LOW (ref 101–111)
Glucose, Bld: 139 mg/dL — ABNORMAL HIGH (ref 65–99)
Potassium: 3.6 mmol/L (ref 3.5–5.1)
SODIUM: 137 mmol/L (ref 135–145)

## 2015-02-12 LAB — URINALYSIS, ROUTINE W REFLEX MICROSCOPIC
Bilirubin Urine: NEGATIVE
Glucose, UA: NEGATIVE mg/dL
Hgb urine dipstick: NEGATIVE
Ketones, ur: 15 mg/dL — AB
LEUKOCYTES UA: NEGATIVE
NITRITE: NEGATIVE
Protein, ur: NEGATIVE mg/dL
Specific Gravity, Urine: 1.023 (ref 1.005–1.030)
UROBILINOGEN UA: 1 mg/dL (ref 0.0–1.0)
pH: 5 (ref 5.0–8.0)

## 2015-02-12 LAB — I-STAT TROPONIN, ED: Troponin i, poc: 0 ng/mL (ref 0.00–0.08)

## 2015-02-12 MED ORDER — SODIUM CHLORIDE 0.9 % IV BOLUS (SEPSIS)
1000.0000 mL | Freq: Once | INTRAVENOUS | Status: AC
  Administered 2015-02-12: 1000 mL via INTRAVENOUS

## 2015-02-12 NOTE — ED Notes (Signed)
PA at bedside.

## 2015-02-12 NOTE — ED Notes (Signed)
Pt arrives via EMS with c/o dizziness. Per report, the pt states he was bending down and picking up items at work around 1500 when he became sweaty and dizzy. Initial BP by fire was 86/56, stable en route by EMS. Pt reports he felt generally weak, dizzy, and denied any pain. Now he feels better. CBG 150 en route. IV established. Pt states history of stroke and diabetic.

## 2015-02-12 NOTE — ED Provider Notes (Signed)
CSN: [id number]     Arrival date & time 02/12/15  1543 History   First MD Initiated Contact with Patient 02/12/15 1544     Chief Complaint  Patient presents with  . Dizziness     (Consider location/radiation/quality/duration/timing/severity/associated sxs/prior Treatment) HPI   Blood pressure 129/78, pulse 95, temperature 98.3 F (36.8 C), temperature source Oral, resp. rate 16, height  (1.702 m), weight 200 lb (90.719 kg), SpO2 100 %.  George Armstrong is a 54 y.o. 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 He states that he felt like he needed to sit down and he lay down on top of boxes, the event was witnessed and patient denies loss of consciousness. Initial BP by fire was 86/56, CBG was 150. He's been eating and drinking normally, compliant with his diabetic medications, has not checked his blood sugar recently. Patient denies increase in urination, diarrhea, melena, hematochezia, headache, chest pain, shortness of breath, abdominal pain, change in vision, dysarthria, ataxia, unilateral weakness, change in sensation. States this is dissimilar from his prior stroke. He takes a full dose aspirin daily which he had this morning.  Neuro: Pearlean Brownie  Past Medical History  Diagnosis Date  . Diabetes mellitus without complication   . Hypertension   . Stroke    Past Surgical History  Procedure Laterality Date  . Cholecystectomy    . Ep implantable device N/A 10/17/2014    Procedure: Loop Recorder Insertion;  Surgeon: Duke Salvia, MD;  Location: [facility] INVASIVE CV LAB;  Service: Cardiovascular;  Laterality: N/A;  . Tee without cardioversion N/A 10/17/2014    Procedure: TRANSESOPHAGEAL ECHOCARDIOGRAM (TEE);  Surgeon: Thurmon Fair, MD;  Location: [facility] ENDOSCOPY;  Service: Cardiovascular;  Laterality: N/A;   Family History  Problem Relation Age of Onset  . Stroke Brother   . Diabetes Brother   . Stroke Mother   . Cancer Father    Social History  Substance Use Topics  . Smoking status: Former Games developer  . Smokeless tobacco: Never Used  . Alcohol Use: Yes     Comment: 2 x / month    Review of Systems  10 systems reviewed and found to be negative, except as noted in the HPI.   Allergies  Review of patient's allergies indicates no known allergies.  Home Medications   Prior to Admission medications   Medication Sig Start Date End Date Taking? Authorizing Provider  aspirin 325 MG tablet Take 1 tablet (325 mg total) by mouth daily. 10/17/14  Yes Layne Benton, NP  atorvastatin (LIPITOR) 80 MG tablet Take 80 mg by mouth daily.    Yes Historical Provider, MD  hydrochlorothiazide (HYDRODIURIL) 25 MG tablet Take 25 mg by mouth daily.   Yes Historical Provider, MD  ibuprofen (ADVIL,MOTRIN) 200 MG tablet Take 200 mg by mouth every 6 (six) hours as needed for headache.   Yes Historical Provider, MD  lisinopril (PRINIVIL,ZESTRIL) 20 MG tablet Take 1 tablet (20 mg total) by mouth daily. 10/17/14  Yes Layne Benton, NP  metFORMIN (GLUCOPHAGE-XR) 500 MG 24 hr tablet Take 500 mg by mouth 4 (four) times daily. Patient states he does take 4 times daily per patient   Yes Historical Provider, MD   BP 130/83 mmHg  Pulse  79  Temp(Src) 98.3 F (36.8 C) (Oral)  Resp 17  Ht  (1.702 m)  Wt 200 lb (90.719 kg)  BMI 31.32 kg/m2  SpO2 97% Physical Exam  Constitutional: He is oriented to person, place, and time. He appears well-developed and well-nourished.  HENT:  Head: Normocephalic and atraumatic.  Mouth/Throat: Oropharynx is clear and moist.  Eyes: Conjunctivae and EOM are normal. Pupils are equal, round, and reactive to light.  No TTP of maxillary or frontal sinuses  No TTP or induration of temporal arteries  bilaterally  Neck: Normal range of motion. Neck supple.  FROM to C-spine. Pt can touch chin to chest without discomfort. No TTP of midline cervical spine.   Cardiovascular: Normal rate, regular rhythm and intact distal pulses.   Pulmonary/Chest: Effort normal and breath sounds normal. No respiratory distress. He has no wheezes. He has no rales. He exhibits no tenderness.  Abdominal: Soft. Bowel sounds are normal. There is no tenderness.  Musculoskeletal: Normal range of motion. He exhibits no edema or tenderness.  Neurological: He is alert and oriented to person, place, and time. No cranial nerve deficit.  II-Visual fields grossly intact. III/IV/VI-Extraocular movements intact.  Pupils reactive bilaterally. V/VII-Smile symmetric, equal eyebrow raise,  facial sensation intact VIII- Hearing grossly intact IX/X-Normal gag XI-bilateral shoulder shrug XII-midline tongue extension Motor: 5/5 bilaterally with normal tone and bulk Cerebellar: Normal finger-to-nose  and normal heel-to-shin test.   Romberg negative Ambulates with a coordinated gait   Nursing note and vitals reviewed.   ED Course  Procedures (including critical care time) Labs Review Labs Reviewed  BASIC METABOLIC PANEL - Abnormal; Notable for the following:    Chloride 98 (*)    Glucose, Bld 139 (*)    All other components within normal limits  URINALYSIS, ROUTINE W REFLEX MICROSCOPIC (NOT AT [facility]) - Abnormal; Notable for the following:    Ketones, ur 15 (*)    All other components within normal limits  CBC  I-STAT TROPOININ, ED    Imaging Review No results found. I have personally reviewed and evaluated these images and lab results as part of my medical decision-making.   EKG Interpretation None      MDM   Final diagnoses:  Near syncope    Filed Vitals:   02/12/15 1550 02/12/15 1615 02/12/15 1630 02/12/15 1645  BP: 129/78 133/81 133/82 130/83  Pulse: 95 78 72 79  Temp: 98.3 F (36.8 C)     TempSrc:  Oral     Resp: Height:  (1.702 m)     Weight: 200 lb (90.719 kg)     SpO2: 100% 98% 96% 97%    Medications  sodium chloride 0.9 % bolus 1,000 mL (1,000 mLs Intravenous New Bag/Given 02/12/15 1643)    George Armstrong is a pleasant 54 y.o. male presenting with presyncopal sensation while at work. She was initially found to be hypotensive with a systolic of 85. Normal blood glucose. 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 Compliant with daily full dose aspirin, neuro exam today is nonfocal. Patient states he is back at his baseline.   EKG unchanged from prior, blood work including troponin negative. A few ketones in the urine consistent with dehydration. Vital signs are not orthostatic, patient was bolused 1 L in the ED, ambulating without issue and feels comfortable going home.  Discussed case with  attending physician who agrees with care plan and disposition.   Evaluation does not show pathology that would require ongoing emergent intervention or inpatient treatment. Pt is hemodynamically stable and mentating appropriately. Discussed findings and plan with patient/guardian, who agrees with care plan. All questions answered. Return precautions discussed and outpatient follow up given.      Wynetta Emery, PA-C 02/12/15 1829  Raeford Razor, MD 02/13/15 1539

## 2015-02-12 NOTE — Discharge Instructions (Signed)
Please follow with your primary care doctor in the next 2 days for a check-up. They must obtain records for further management.   Do not hesitate to return to the Emergency Department for any new, worsening or concerning symptoms.    Near-Syncope Near-syncope (commonly known as near fainting) is sudden weakness, dizziness, or feeling like you might pass out. During an episode of near-syncope, you may also develop pale skin, have tunnel vision, or feel sick to your stomach (nauseous). Near-syncope may occur when getting up after sitting or while standing for a long time. It is caused by a sudden decrease in blood flow to the brain. This decrease can result from various causes or triggers, most of which are not serious. However, because near-syncope can sometimes be a sign of something serious, a medical evaluation is required. The specific cause is often not determined. HOME CARE INSTRUCTIONS  Monitor your condition for any changes. The following actions may help to alleviate any discomfort you are experiencing:  Have someone stay with you until you feel stable.  Lie down right away and prop your feet up if you start feeling like you might faint. Breathe deeply and steadily. Wait until all the symptoms have passed. Most of these episodes last only a few minutes. You may feel tired for several hours.   Drink enough fluids to keep your urine clear or pale yellow.   If you are taking blood pressure or heart medicine, get up slowly when seated or lying down. Take several minutes to sit and then stand. This can reduce dizziness.  Follow up with your health care provider as directed. SEEK IMMEDIATE MEDICAL CARE IF:   You have a severe headache.   You have unusual pain in the chest, abdomen, or back.   You are bleeding from the mouth or rectum, or you have black or tarry stool.   You have an irregular or very fast heartbeat.   You have repeated fainting or have seizure-like jerking during  an episode.   You faint when sitting or lying down.   You have confusion.   You have difficulty walking.   You have severe weakness.   You have vision problems.  MAKE SURE YOU:   Understand these instructions.  Will watch your condition.  Will get help right away if you are not doing well or get worse. Document Released: 05/04/2005 Document Revised: 05/09/2013 Document Reviewed: 10/07/2012 [facility] Patient Information 2015 [place], [place]. This information is not intended to replace advice given to you by your health care provider. Make sure you discuss any questions you have with your health care provider.  Electrophysiology Study An electrophysiology (EP) study is an invasive heart test done through catheters placed in a large vein in your groin, arm, neck, or chest. It is done to evaluate the electrical conduction system of your heart. An EP study is done if other heart tests have not found or fully explained the cause of symptoms such as:  Dizziness or fainting.  A fast heartbeat (tachycardia).  A slow heartbeat (bradycardia). If the cause of your symptoms is found during the EP study, your health care provider will discuss treatment options. Some treatment options that may be done to correct your symptoms include:  Cardiac ablation. Cardiac ablation destroys a small area of heart tissue that may be causing tachycardia.  Implantable cardioverter defibrillator (ICD). An ICD can detect a fast or abnormal heart rate. When an abnormal rhythm is detected, the ICD shocks the heart to restore  it to a normal heart rhythm. LET [facility] CARE PROVIDER KNOW ABOUT:   Any allergies you have.  All medicines you are taking, including vitamins, herbs, eye drops, creams, and over-the-counter medicines.  Previous problems you or members of your family have had with the use of anesthetics.  Any blood disorders you have.  Previous surgeries you have had.  Medical conditions you  have. RISKS AND COMPLICATIONS  Generally, this is a safe procedure. However, as with any procedure, problems can occur. Possible problems include:  Tachycardia that does not go away. This may require shocking your heart (cardioversion).  Bleeding or bruising from the catheter insertion sites.  Infection at the catheter insertion sites.  Temporary or permanent heart rhythm abnormalities.  Temporary changes in blood pressure.  Puncture (perforation) of the heart wall. This can cause bleeding between the heart and the sac that surrounds it (cardiac tamponade). This is a life-threatening condition that may require heart surgery.  Possible cardiac arrest or fatal heart arrhythmia. BEFORE THE PROCEDURE  Do not eat or drink before the EP study as instructed by your health care provider.  Be sure to urinate before the EP study.  If you are going home after the EP study, someone will need to drive you home and stay with you for 24 hours. PROCEDURE The EP study is performed in a catheterization laboratory. This is a room set up to do heart procedures.   You will be given medicine through an intravenous (IV) access to reduce discomfort and help you relax.  The area where the catheter will be inserted will be shaved as needed and cleansed. Sterile drapes will cover you. This will keep the area sterile.  Thin, flexible tubes (catheters) with an electrode tip will be inserted into a large vein. From there, the catheters are guided to the heart using a type of X-ray machine (fluoroscopy). Once in the heart, the catheters evaluate the electrical activity of your heart.  If you are awake during the EP study, you may feel dizzy or light-headed. Your heart rate may temporarily increase or you may feel your heart beating hard. Tell your health care provider if you feel dizzy, nauseated, or have chest pain or pressure during the EP study.  When the EP study is done, the catheters are removed.  Firm  pressure is applied to the insertion sites. This is done to prevent bleeding. AFTER THE PROCEDURE  You will need to lie flat for a few hours or as told by your health care provider. You will need to keep your legs straight. Do not bend or cross your legs. This is done so the clot at the insertion does not break loose and cause bleeding.  If you took blood thinners before the EP study, ask your health care provider when you can start taking them again. Document Released: 10/22/2009 Document Revised: 05/09/2013 Document Reviewed: 11/16/2012 [facility] Patient Information 2015 [place], [place]. This information is not intended to replace advice given to you by your health care provider. Make sure you discuss any questions you have with your health care provider.

## 2015-02-14 ENCOUNTER — Ambulatory Visit (INDEPENDENT_AMBULATORY_CARE_PROVIDER_SITE_OTHER): Admitting: *Deleted

## 2015-02-14 DIAGNOSIS — I639 Cerebral infarction, unspecified: Secondary | ICD-10-CM | POA: Diagnosis not present

## 2015-02-15 NOTE — Progress Notes (Signed)
Loop recorder 

## 2015-02-18 LAB — CUP PACEART REMOTE DEVICE CHECK: Date Time Interrogation Session: 20160929171017

## 2015-02-18 NOTE — Progress Notes (Signed)
Carelink summary report received. Battery status OK. Normal device function. No new symptom episodes, tachy episodes, brady, or pause episodes. No new AF episodes. Monthly summary reports and ROV w/ SK 6/17. 

## 2015-02-25 ENCOUNTER — Encounter: Payer: Self-pay | Admitting: Internal Medicine

## 2015-02-26 ENCOUNTER — Encounter: Payer: Self-pay | Admitting: Internal Medicine

## 2015-02-27 ENCOUNTER — Telehealth: Payer: Self-pay | Admitting: *Deleted

## 2015-02-27 NOTE — Telephone Encounter (Signed)
LMOM requesting call back.  Need to know if patient was symptomatic during tachy episode on LINQ transmission from 02/24/15. 

## 2015-03-07 NOTE — Telephone Encounter (Signed)
LMOM requesting call back

## 2015-03-15 NOTE — Telephone Encounter (Signed)
Third attempt, LMOVM requesting call back.

## 2015-03-18 ENCOUNTER — Ambulatory Visit (INDEPENDENT_AMBULATORY_CARE_PROVIDER_SITE_OTHER): Admitting: *Deleted

## 2015-03-18 DIAGNOSIS — I639 Cerebral infarction, unspecified: Secondary | ICD-10-CM

## 2015-03-19 NOTE — Telephone Encounter (Signed)
Patient returned call.  States he was asymptomatic with tachy episode on LINQ transmission from 02/24/15.  Patient aware to call with worsening symptoms, questions, or concerns.  He denies additional questions at this time.

## 2015-03-21 NOTE — Progress Notes (Signed)
Loop recorder 

## 2015-04-13 LAB — CUP PACEART REMOTE DEVICE CHECK: MDC IDC SESS DTM: 20161029173553

## 2015-04-13 NOTE — Progress Notes (Signed)
Carelink summary report received. Battery status OK. Normal device function. No new symptom, brady, pause, or AF episodes. 4 tachy episodes, available ECG suggests SVT, duration 27sec, peak V 188bpm, patient asymptomatic. Monthly summary reports and ROV with SK in 10/2015.

## 2015-04-15 ENCOUNTER — Ambulatory Visit (INDEPENDENT_AMBULATORY_CARE_PROVIDER_SITE_OTHER): Admitting: *Deleted

## 2015-04-15 DIAGNOSIS — I639 Cerebral infarction, unspecified: Secondary | ICD-10-CM | POA: Diagnosis not present

## 2015-04-17 NOTE — Progress Notes (Signed)
LOOP RECORDER  

## 2015-05-15 ENCOUNTER — Ambulatory Visit (INDEPENDENT_AMBULATORY_CARE_PROVIDER_SITE_OTHER): Admitting: *Deleted

## 2015-05-15 ENCOUNTER — Encounter: Payer: Self-pay | Admitting: Cardiology

## 2015-05-15 DIAGNOSIS — I639 Cerebral infarction, unspecified: Secondary | ICD-10-CM

## 2015-05-15 NOTE — Progress Notes (Signed)
Carelink Summary Report / Loop Recorder 

## 2015-05-26 LAB — CUP PACEART REMOTE DEVICE CHECK: Date Time Interrogation Session: 20161128174052

## 2015-06-14 ENCOUNTER — Ambulatory Visit (INDEPENDENT_AMBULATORY_CARE_PROVIDER_SITE_OTHER): Admitting: *Deleted

## 2015-06-14 DIAGNOSIS — I639 Cerebral infarction, unspecified: Secondary | ICD-10-CM

## 2015-06-14 NOTE — Progress Notes (Signed)
Carelink Summary Report / Loop Recorder 

## 2015-06-22 LAB — CUP PACEART REMOTE DEVICE CHECK: Date Time Interrogation Session: 20161228181138

## 2015-07-04 ENCOUNTER — Ambulatory Visit (INDEPENDENT_AMBULATORY_CARE_PROVIDER_SITE_OTHER): Payer: TRICARE For Life (TFL) | Admitting: Neurology

## 2015-07-04 ENCOUNTER — Encounter: Payer: Self-pay | Admitting: Neurology

## 2015-07-04 VITALS — BP 122/77 | HR 77 | Ht 67.0 in | Wt 198.6 lb

## 2015-07-04 DIAGNOSIS — I6990 Unspecified sequelae of unspecified cerebrovascular disease: Secondary | ICD-10-CM | POA: Diagnosis not present

## 2015-07-04 NOTE — Progress Notes (Signed)
[facility] [address] [place]. [place] [address] [phone]       OFFICE FOLLOW-UP NOTE  Mr. George Armstrong Date of Birth:  06-24-1960 Medical Record Number:  [id number]   HPI: George Armstrong is an 55 y.o. male who was noted by his wife to start having trouble with his vision on 10/04/14 Thursday evening. He made a comment that he needed to change the lens on his glasses. Had no other complaints that day. On Friday had complaints of dizziness. This was intermittent and by night time went to bed without further complaints. On Saturday complained of a headache that was intermittent. On Sunday the patient had no complaints. On Monday at 0900 became severely dizzy and was having some trouble getting around. He was taken to [place]. They did a scan that was unremarkable and treated his headache. The patient was very lethargic after his headache treatment and was sent home. He remained lethargic and slept all day on Tuesday. Went to his primary again on Wednesday 10/10/14 and was referred back to [facility] for further evaluation. BP elevated on arrival. CT head on admission 10/10/14 showed  extensive abnormal attenuation throughout the mid and upper cerebellar hemispheres with relative compression of the fourth ventricle. Early enlargement of the lateral and third ventricles.CT angiogram head and brain showed Extensive acute infarct in the superior cerebellum bilaterally with mass-effect and compression of the fourth ventricle.Bilateral carotid and vertebral arteries widely patent in the neck. No significant intracranial stenosis or mass vascular malformation. 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 MRI brain showed 1.Extensive patchy areas of acute/subacute infarction involving the posterior circulation. This is most prominent within the superior aspect of the cerebellum bilaterally but also involves the posterior left pons, left  thalamus, posterior medial bilateral parietal lobes, and left occipital pole. 2. T2 changes and leptomeningeal enhancement suggest a subacute time frame. 3. There is some hemorrhagic conversion of the superior cerebellar infarcts with blood products noted. 4. Mild periventricular white matter changes are noted otherwise. 5. Mass effect on the cerebellum with partial effacement of the fourth ventricle and quadrigeminal plate cistern. There is no gross herniation.Mrv Head was normal.2D Echocardiogram showed LV  cavity size was normal. Wall thickness wasincreased in a pattern of mild LVH. Systolic function was normal.The estimated ejection fraction was in the range of 55% to 60%.Wall motion was normal; there were no regional wall motion abnormalities. TEE Mild LVH, otherwise normal echo.EKG and Telemetry monitoring showed sinus rhytmn. Hemoglobin A1c was elevated at 8.8. Total cholesterol is elevated 359, triglycerides 115, HDL 55 and LDL cholesterol 281 mg percent. He was started on Lipitor and aspirin. Hypercoagulable panel labs were all normal .Patient had loop recorder inserted to monitor for paroxysmal atrial fibrillation and so far it has not been detected. Patient states his done extremely well since discharge. He is tolerating aspirin well without bleeding or bruising as well as Lipitor without myalgias or arthralgias. He was started on metformin for diabetes and advised to follow-up with his primary physician  He has been using CPAP daily for his sleep apnea. He states he has no residual neurological deficits though he has to be slightly careful with his balance. His speech and dizziness are normal. He in fact wants a letter to go back to work. Update 07/04/2015 : He returns for follow-up after his last visit the 6 months ago. He continues to do well without recurrent stroke or TIA symptoms. He is tolerating aspirin well  without bleeding or bruising. He states his blood pressure is well controlled and  today it is 1-2/77. He recently had lipid profile and hemoglobin A1c checked of in December last year both of which were fine but I do not have those results. Is tolerating Lipitor well without muscle aches or pains. Patient's fasting sugars range in the 110 range usually. Patient has not decided to participate in the RESPECT ESUS trial and would like to stay on aspirin. He has no physical deficits from his stroke or any complaints. He wants to start riding a motorcycle again but his wife is reluctant. ROS:   14 system review of systems is positive for  , snoring, apnoea and all other systems negative  PMH:  Past Medical History  Diagnosis Date  . Diabetes mellitus without complication (HCC)   . Hypertension   . Stroke [facility])     Social History:  Social History   Social History  . Marital Status: Married    Spouse Name: N/A  . Number of Children: N/A  . Years of Education: N/A   Occupational History  . Not on file.   Social History Main Topics  . Smoking status: Former Games developer  . Smokeless tobacco: Never Used  . Alcohol Use: Yes     Comment: 2 x / month  . Drug Use: Not on file  . Sexual Activity: Yes   Other Topics Concern  . Not on file   Social History Narrative    Medications:   Current Outpatient Prescriptions on File Prior to Visit  Medication Sig Dispense Refill  . aspirin 325 MG tablet Take 1 tablet (325 mg total) by mouth daily. 30 tablet 12  . atorvastatin (LIPITOR) 80 MG tablet Take 80 mg by mouth daily.     . hydrochlorothiazide (HYDRODIURIL) 25 MG tablet Take 25 mg by mouth daily.    Marland Kitchen ibuprofen (ADVIL,MOTRIN) 200 MG tablet Take 200 mg by mouth every 6 (six) hours as needed for headache.    . lisinopril (PRINIVIL,ZESTRIL) 20 MG tablet Take 1 tablet (20 mg total) by mouth daily. 30 tablet 2  . metFORMIN (GLUCOPHAGE-XR) 500 MG 24 hr tablet Take 500 mg by mouth 4 (four) times daily. PATIENT TAKES 2 PILLS IN THE AM 2 PM IN THE EVENING     No current  facility-administered medications on file prior to visit.    Allergies:  No Known Allergies  Physical Exam General: middle aged middle-aged African-American male, seated, in no evident distress Head: head normocephalic and atraumatic.  Neck: supple with no carotid or supraclavicular bruits Cardiovascular: regular rate and rhythm, no murmurs Musculoskeletal: no deformity Skin:  no rash/petichiae Vascular:  Normal pulses all extremities Filed Vitals:   07/04/15 0840  BP: 122/77  Pulse: 77   Neurologic Exam Mental Status: Awake and fully alert. Oriented to place and time. Recent and remote memory intact. Attention span, concentration and fund of knowledge appropriate. Mood and affect appropriate.  Cranial Nerves: Fundoscopic exam not done.  . Pupils equal, briskly reactive to light. Extraocular movements full without nystagmus. Visual fields full to confrontation. Hearing intact. Facial sensation intact. Face, tongue, palate moves normally and symmetrically.  Motor: Normal bulk and tone. Normal strength in all tested extremity muscles. Sensory.: intact to touch ,pinprick .position and vibratory sensation.  Coordination: Rapid alternating movements normal in all extremities. Finger-to-nose and heel-to-shin performed accurately bilaterally. Gait and Station: Arises from chair without difficulty. Stance is normal. Gait demonstrates normal stride length and balance . Unable  to heel, toe and tandem walk without difficulty.  Reflexes: 1+ and symmetric. Toes downgoing.   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 Patient has done remarkably well with practically no deficits. Vascular risk factors of diabetes, hypertension, hyperlipidemia, mild opacity and sleep apnea    PLAN: I had a long d/w patient and his wife about his  recent stroke, risk for recurrent stroke/TIAs, personally independently reviewed imaging studies and stroke evaluation results and answered questions.Continue aspirin 325 mg daily  for secondary stroke prevention and maintain strict control of hypertension with blood pressure goal below 130/90, diabetes with hemoglobin A1c goal below 6.5% and lipids with LDL cholesterol goal below 70 mg/dL. I also advised the patient to eat a healthy diet with plenty of whole grains, cereals, fruits and vegetables, exercise regularly and maintain ideal body weight Followup in the future with stroke nurse practitioner in 1 year or call earlier if necessary  Delia Heady, MD   Note: This document was prepared with digital dictation and possible smart phrase technology. Any transcriptional errors that result from this process are unintentional

## 2015-07-04 NOTE — Patient Instructions (Signed)
I had a long d/w patient and his wife about his recent stroke, risk for recurrent stroke/TIAs, personally independently reviewed imaging studies and stroke evaluation results and answered questions.Continue aspirin 325 mg daily  for secondary stroke prevention and maintain strict control of hypertension with blood pressure goal below 130/90, diabetes with hemoglobin A1c goal below 6.5% and lipids with LDL cholesterol goal below 70 mg/dL. I also advised the patient to eat a healthy diet with plenty of whole grains, cereals, fruits and vegetables, exercise regularly and maintain ideal body weight Followup in the future with stroke nurse practitioner in 1 year or call earlier if necessary

## 2015-07-15 ENCOUNTER — Ambulatory Visit (INDEPENDENT_AMBULATORY_CARE_PROVIDER_SITE_OTHER): Admitting: *Deleted

## 2015-07-15 DIAGNOSIS — I639 Cerebral infarction, unspecified: Secondary | ICD-10-CM | POA: Diagnosis not present

## 2015-07-15 NOTE — Progress Notes (Signed)
Carelink Summary Report / Loop Recorder 

## 2015-07-16 ENCOUNTER — Encounter: Payer: Self-pay | Admitting: Internal Medicine

## 2015-07-29 LAB — CUP PACEART REMOTE DEVICE CHECK: MDC IDC SESS DTM: 20170226190938

## 2015-07-29 NOTE — Progress Notes (Signed)
Carelink summary report received. Battery status OK. Normal device function. No new symptom episodes, tachy episodes, brady, or pause episodes. No new AF episodes. Monthly summary reports and ROV/PRN 

## 2015-08-03 LAB — CUP PACEART REMOTE DEVICE CHECK: MDC IDC SESS DTM: 20170127183930

## 2015-08-03 NOTE — Progress Notes (Signed)
Carelink summary report received. Battery status OK. Normal device function. No new symptom episodes, tachy episodes, brady, or pause episodes. No new AF episodes. Monthly summary reports and ROV/PRN 

## 2015-08-09 NOTE — Therapy (Signed)
[facility] [address] [place], [place], [address] Phone: [phone]   Fax:  [phone]  Patient Details  Name: George Armstrong MRN: [id number] Date of Birth: 02/12/1961 Referring Provider:  No ref. provider found  Encounter Date: 08/09/2015  SPEECH THERAPY DISCHARGE SUMMARY  Visits from Start of Care: <10  Current functional level related to goals / functional outcomes: SLP Short Term Goals - 12/07/14 1657     SLP SHORT TERM GOAL #1     Title  pt will name 8 items in non-concrete category, average     Status  Achieved     SLP SHORT TERM GOAL #2     Title  pt will demo >15 minutes of mod complex conversation using compensatory strategies in 90% of necessary opportunities     Status  Achieved     SLP SHORT TERM GOAL #3     Title  Pt will report appropriate independent use of medication administration system of his choosing for one week      Status  Deferred  due to practice on attention     SLP SHORT TERM GOAL #4     Title  Pt will demo appropriate divided attention skills in simple cognitive-linguistic tasks 60% of the time     Status  Achieved                SLP Long Term Goals - 12/12/14 0859     SLP LONG TERM GOAL #1     Title  pt will demo >20 minutes of mod complex conversation with use of compensations at 95% success, over three sessions     Status  Achieved     SLP LONG TERM GOAL #2     Title  pt will demo appropriate medication administration for two weeks using the system of his own choosing     Status  Achieved     SLP LONG TERM GOAL #3     Title  pt will demo appropriate divided attention in simple cognitive-linguistic tasks with 90% success over two sessions     Time  4     Period  Weeks     Status  Revised       Remaining deficits: Cognitive-communication deficits.   Education / Equipment: Compensatory strategies for attention  Plan: Patient agrees to discharge.  Patient goals were  partially met. Patient is being discharged due to not returning since the last visit.  ?????       [place] ,[place], CCC-SLP   08/09/2015, 11:02 AM  [facility] [address] [place], [place], [address] Phone: [phone]   Fax:  [phone]

## 2015-08-13 ENCOUNTER — Ambulatory Visit (INDEPENDENT_AMBULATORY_CARE_PROVIDER_SITE_OTHER): Admitting: *Deleted

## 2015-08-13 DIAGNOSIS — I639 Cerebral infarction, unspecified: Secondary | ICD-10-CM

## 2015-08-14 LAB — CUP PACEART REMOTE DEVICE CHECK: Date Time Interrogation Session: 20170328190803

## 2015-08-14 NOTE — Progress Notes (Signed)
Carelink Summary Report / Loop Recorder 

## 2015-09-12 ENCOUNTER — Ambulatory Visit (INDEPENDENT_AMBULATORY_CARE_PROVIDER_SITE_OTHER): Admitting: *Deleted

## 2015-09-12 DIAGNOSIS — I639 Cerebral infarction, unspecified: Secondary | ICD-10-CM | POA: Diagnosis not present

## 2015-09-13 NOTE — Progress Notes (Signed)
Carelink Summary Report / Loop Recorder 

## 2015-10-15 ENCOUNTER — Ambulatory Visit (INDEPENDENT_AMBULATORY_CARE_PROVIDER_SITE_OTHER): Admitting: *Deleted

## 2015-10-15 DIAGNOSIS — I639 Cerebral infarction, unspecified: Secondary | ICD-10-CM

## 2015-10-16 NOTE — Progress Notes (Signed)
Carelink Summary Report / Loop Recorder 

## 2015-10-18 NOTE — Progress Notes (Signed)
Carelink summary report received. Battery status OK. Normal device function. No new symptom episodes, tachy episodes, brady, or pause episodes. No new AF episodes. Monthly summary reports and ROV/PRN 

## 2015-10-24 LAB — CUP PACEART REMOTE DEVICE CHECK: MDC IDC SESS DTM: 20170427193628

## 2015-10-28 IMAGING — CT CT HEAD W/O CM
1 of 2 series · 15 of 30 positions shown, 19 images · non-contrast
Comparison: CT head October 11, 2014 and MRI head Friday October, 2014

CLINICAL DATA: Posterior circulation infarcts, with hemorrhagic
transformation. Follow-up. History of hypertension and diabetes.

EXAM:
CT HEAD WITHOUT CONTRAST
TECHNIQUE: Contiguous axial images were obtained from the base of the skull
through the vertex without intravenous contrast.

[Series 3: head 2.0 h70h · axial · 0.44mm/px · z∈[-50,+88]mm · 15 of 77 slices shown, 19 images]
[im 4/77  brain]
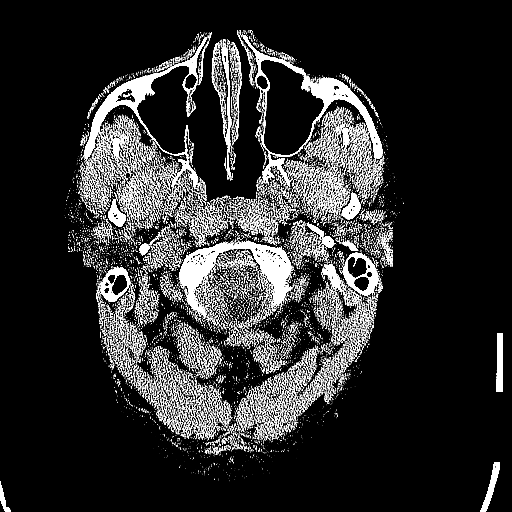
[im 4/77  bone]
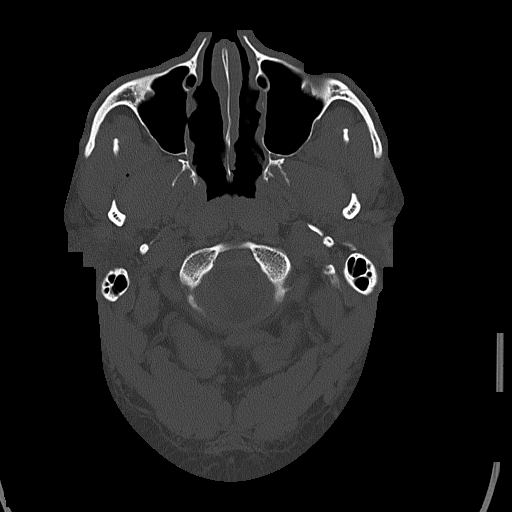
[im 8/77  brain]
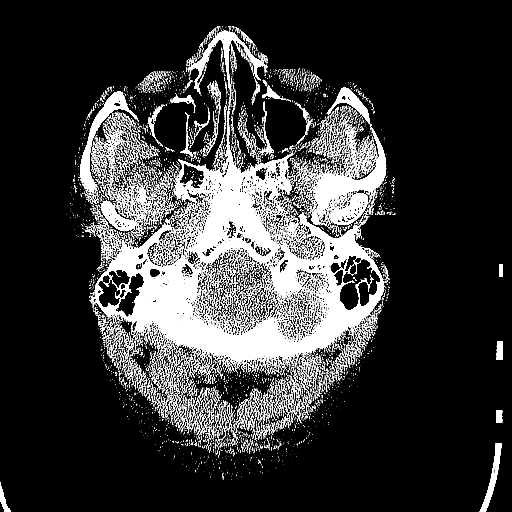
[im 16/77  brain]
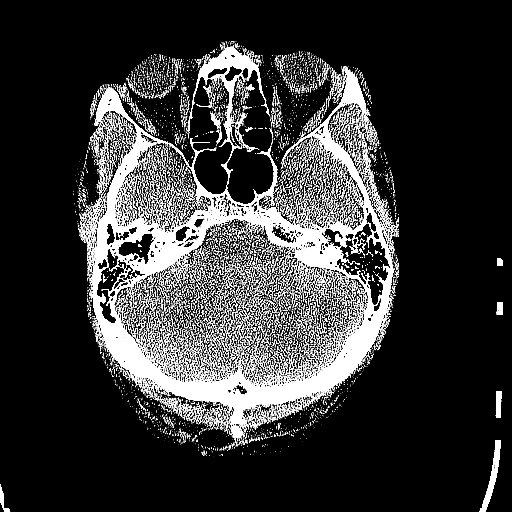
[im 20/77  brain]
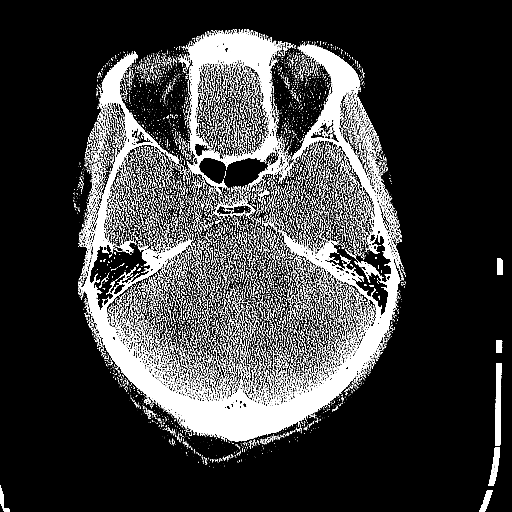
[im 23/77  brain]
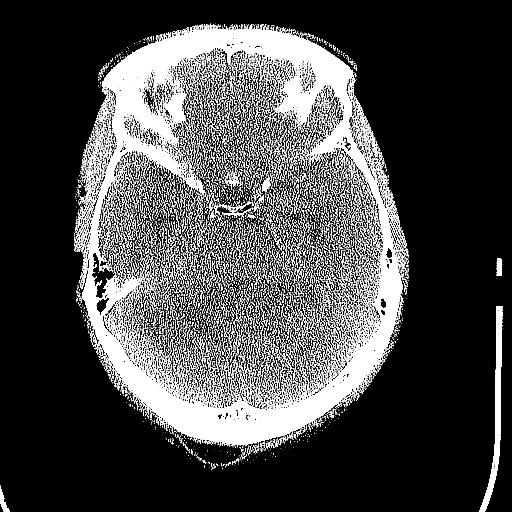
[im 23/77  bone]
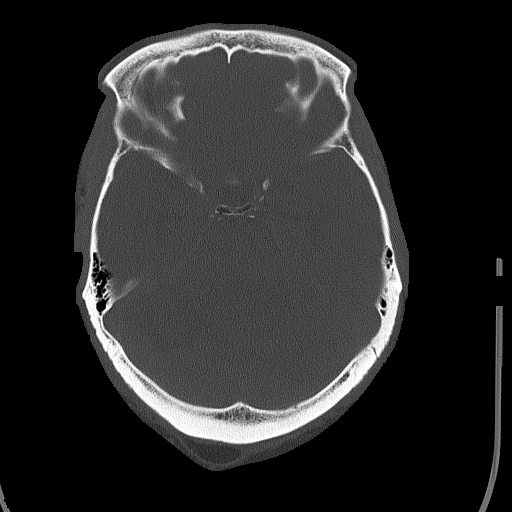
[im 27/77  brain]
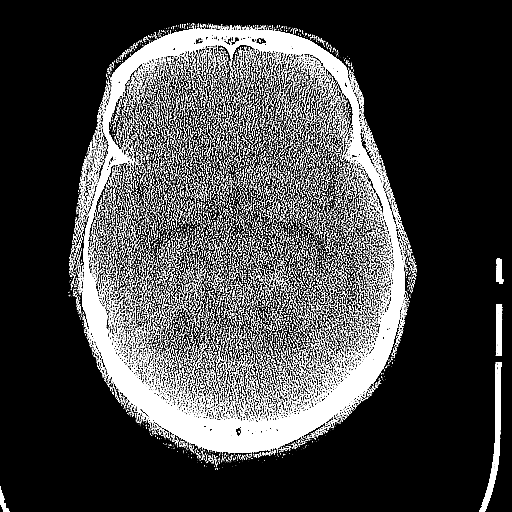
[im 35/77  brain]
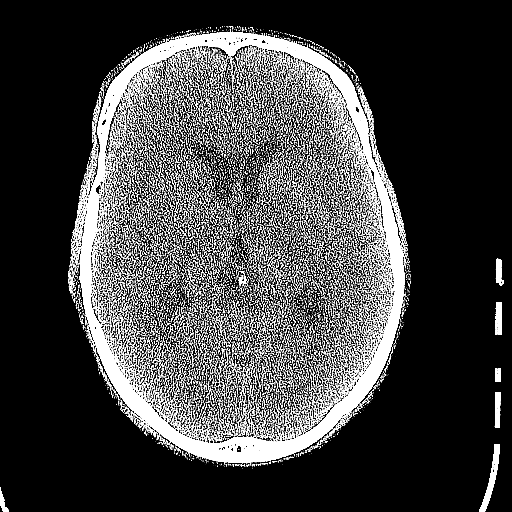
[im 39/77  brain]
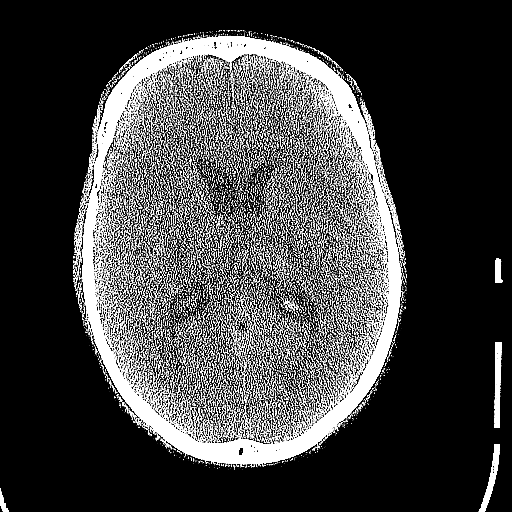
[im 42/77  brain]
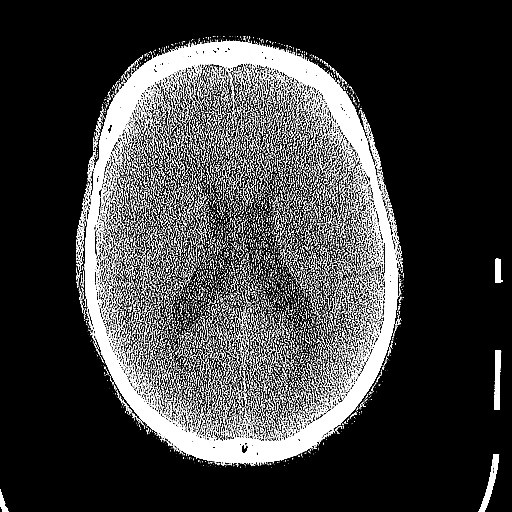
[im 42/77  bone]
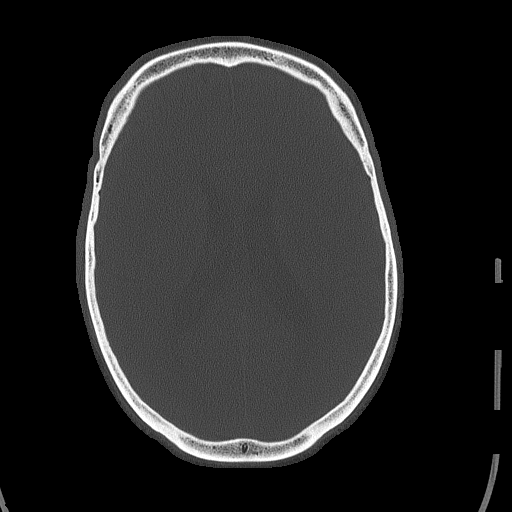
[im 50/77  brain]
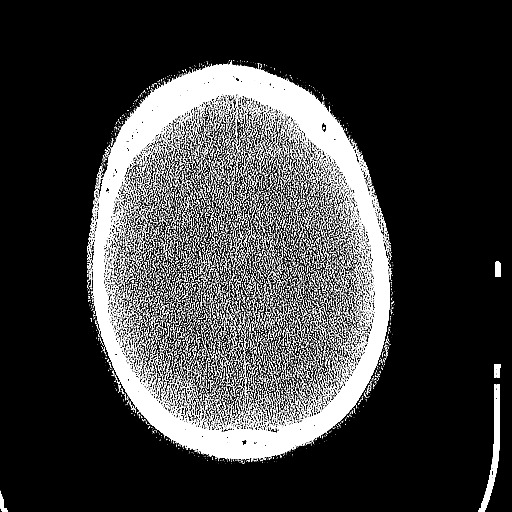
[im 54/77  brain]
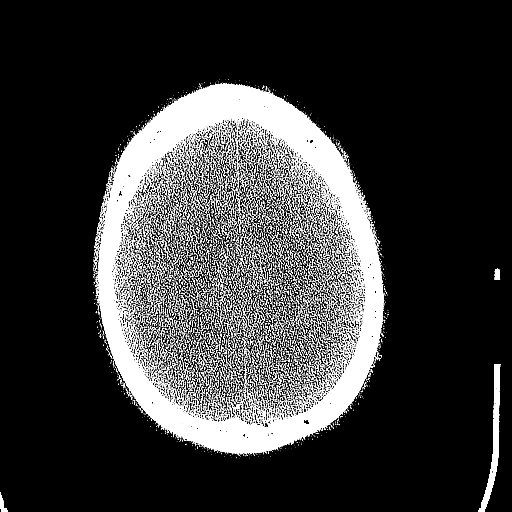
[im 58/77  brain]
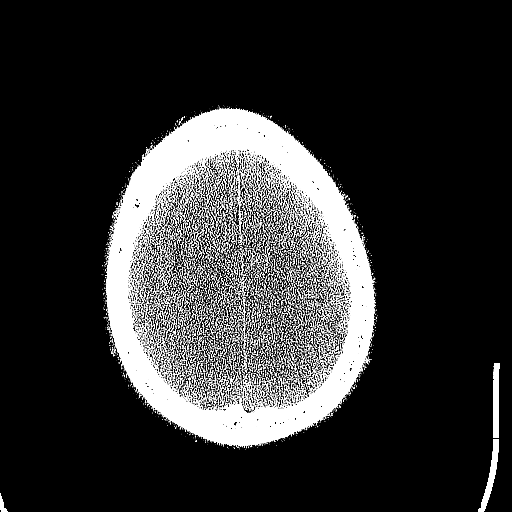
[im 61/77  brain]
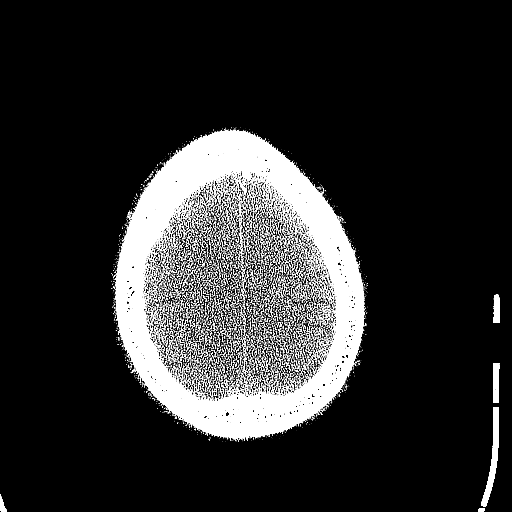
[im 61/77  bone]
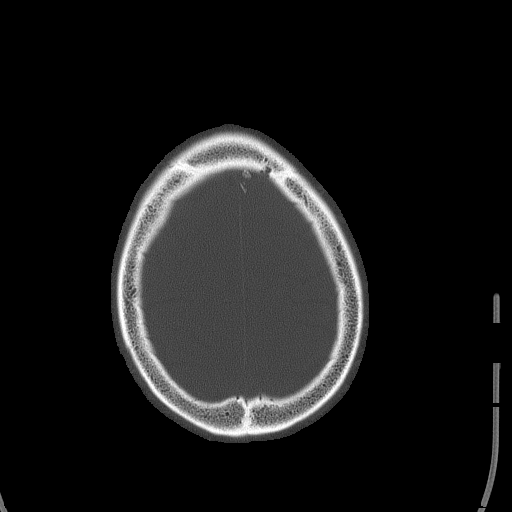
[im 69/77  brain]
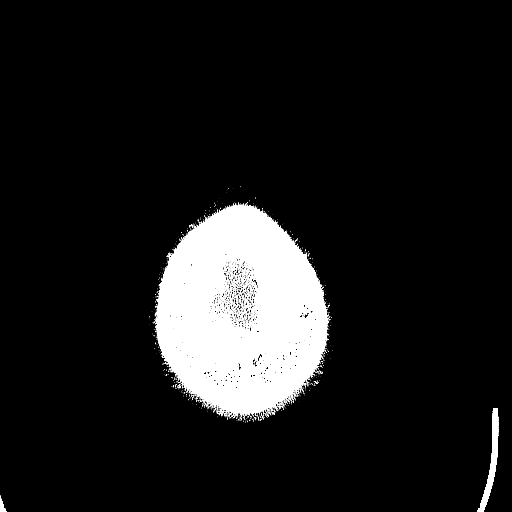
[im 73/77  brain]
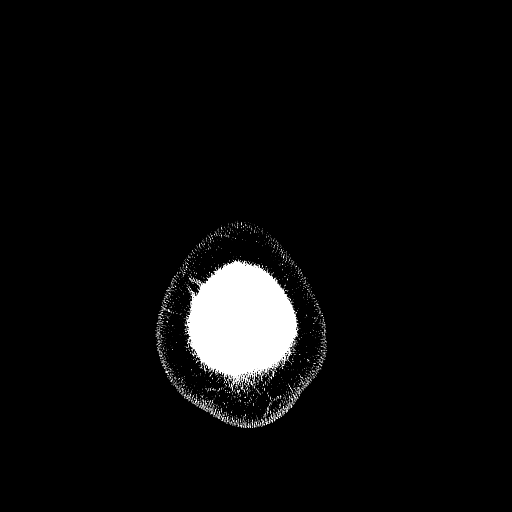

[15 of 30 positions shown; findings below may reference images not displayed]

FINDINGS: Bilateral cerebellar infarcts with edema partially effaces the supra
cerebellar cistern, unchanged, mild effaced quadrigeminal cistern.
Partially effaced fourth ventricle is similar. Mild hydrocephalus
with periventricular interstitial edema. Tiny LEFT thalamus infarct
better seen on prior MRI. No lobar hematoma. No midline shift.

No abnormal extra-axial fluid collections. Mild paranasal sinus
mucosal thickening without air-fluid levels. Mastoid air cells are
well aerated. Ocular globes and orbital contents are unremarkable.
IMPRESSION: Evolving bilateral cerebellar infarcts with extensive edema
resulting in mild hydrocephalus, unchanged. LEFT thalamus infarct
better seen on prior MRI. No lobar hematoma or new acute large
vascular territory infarct.

## 2015-11-11 ENCOUNTER — Ambulatory Visit (INDEPENDENT_AMBULATORY_CARE_PROVIDER_SITE_OTHER): Admitting: *Deleted

## 2015-11-11 DIAGNOSIS — I639 Cerebral infarction, unspecified: Secondary | ICD-10-CM | POA: Diagnosis not present

## 2015-11-12 NOTE — Progress Notes (Signed)
Carelink Summary Report / Loop Recorder 

## 2015-11-21 LAB — CUP PACEART REMOTE DEVICE CHECK
MDC IDC SESS DTM: 20170527194019
MDC IDC SESS DTM: 20170626200639

## 2015-12-11 ENCOUNTER — Ambulatory Visit (INDEPENDENT_AMBULATORY_CARE_PROVIDER_SITE_OTHER): Admitting: *Deleted

## 2015-12-11 DIAGNOSIS — I639 Cerebral infarction, unspecified: Secondary | ICD-10-CM

## 2015-12-13 NOTE — Progress Notes (Signed)
Carelink Summary Report / Loop Recorder 

## 2015-12-27 LAB — CUP PACEART REMOTE DEVICE CHECK: Date Time Interrogation Session: 20170726210848

## 2016-01-10 ENCOUNTER — Ambulatory Visit (INDEPENDENT_AMBULATORY_CARE_PROVIDER_SITE_OTHER): Admitting: *Deleted

## 2016-01-10 DIAGNOSIS — I639 Cerebral infarction, unspecified: Secondary | ICD-10-CM | POA: Diagnosis not present

## 2016-01-14 NOTE — Progress Notes (Signed)
Carelink Summary Report / Loop Recorder 

## 2016-01-17 ENCOUNTER — Encounter: Payer: Self-pay | Admitting: Internal Medicine

## 2016-01-21 ENCOUNTER — Encounter: Payer: Self-pay | Admitting: Internal Medicine

## 2016-02-03 NOTE — Progress Notes (Signed)
      Patient Care Team: Marisue IvanKanhka Linthavong, MD as PCP - General (Family Medicine)   HPI  George Armstrong is a 55 y.o. 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  There have been no palpitations, lightheadedness or syncope.   Tore ligament in his foot playing touch football    Records and Results Reviewed Outpt records and labs Past Medical History:  Diagnosis Date  . Diabetes mellitus without complication (HCC)   . Hypertension   . Stroke [facility](HCC)     Past Surgical History:  Procedure Laterality Date  . CHOLECYSTECTOMY    . EP IMPLANTABLE DEVICE N/A 10/17/2014   Procedure: Loop Recorder Insertion;  Surgeon: Duke SalviaSteven C Klein, MD;  [facility];  Service: Cardiovascular;  Laterality: N/A;  . TEE WITHOUT CARDIOVERSION N/A 10/17/2014   Procedure: TRANSESOPHAGEAL ECHOCARDIOGRAM (TEE);  Surgeon: Thurmon FairMihai Croitoru, MD;  [facility];  Service: Cardiovascular;  Laterality: N/A;    Current Outpatient Prescriptions  Medication Sig Dispense Refill  . aspirin 325 MG tablet Take 1 tablet (325 mg total) by mouth daily. 30 tablet 12  . atorvastatin (LIPITOR) 80 MG tablet Take 80 mg by mouth daily.     . hydrochlorothiazide (HYDRODIURIL) 25 MG tablet Take 25 mg by mouth daily.    Marland Kitchen. ibuprofen (ADVIL,MOTRIN) 200 MG tablet Take 200 mg by mouth every 6 (six) hours as needed for headache.    . lisinopril (PRINIVIL,ZESTRIL) 20 MG tablet Take 1 tablet (20 mg total) by mouth daily. 30 tablet 2  . metFORMIN (GLUCOPHAGE-XR) 500 MG 24 hr tablet Take 1,000 mg by mouth 2 (two) times daily with a meal.     No current facility-administered medications for this visit.     No Known Allergies    Review of Systems negative except from HPI and PMH  Physical Exam BP 116/78   Pulse 75   Ht 5\' 7"  (1.702 m)   Wt 201 lb (91.2 kg)   BMI  31.48 kg/m  Well developed and well nourished in no acute distress HENT normal E scleral and icterus clear Neck Supple JVP flat; carotids brisk and full Clear to ausculation  Regular rate and rhythm, no murmurs gallops or rub Soft with active bowel sounds No clubbing cyanosis  Edema Alert and oriented, grossly normal motor and sensory function Skin Warm and Dry    Assessment and  Plan  Cryptogenic Stroke  Implantable loop recorder   Hypertension  BP well controlled  On statin for secondary stroke prevention   No afib by interrogation        Current medicines are reviewed at length with the patient today .  The patient does not  have concerns regarding medicines.

## 2016-02-04 ENCOUNTER — Ambulatory Visit (INDEPENDENT_AMBULATORY_CARE_PROVIDER_SITE_OTHER): Admitting: Internal Medicine

## 2016-02-04 ENCOUNTER — Encounter (INDEPENDENT_AMBULATORY_CARE_PROVIDER_SITE_OTHER): Payer: Self-pay

## 2016-02-04 ENCOUNTER — Encounter: Payer: Self-pay | Admitting: Internal Medicine

## 2016-02-04 VITALS — BP 116/78 | HR 75 | Ht 67.0 in | Wt 201.0 lb

## 2016-02-04 DIAGNOSIS — I639 Cerebral infarction, unspecified: Secondary | ICD-10-CM

## 2016-02-04 DIAGNOSIS — I10 Essential (primary) hypertension: Secondary | ICD-10-CM | POA: Diagnosis not present

## 2016-02-04 LAB — CUP PACEART REMOTE DEVICE CHECK: MDC IDC SESS DTM: 20170919161143

## 2016-02-04 NOTE — Patient Instructions (Signed)
Medication Instructions: - Your physician recommends that you continue on your current medications as directed. Please refer to the Current Medication list given to you today.  Labwork: - none today  Procedures/Testing: - none ordered  Follow-Up: - Your physician wants you to follow-up in: 1 year with Dr. Graciela HusbandsKlein. You will receive a reminder letter in the mail two months in advance. If you don't receive a letter, please call our office to schedule the follow-up appointment.   Any Additional Special Instructions Will Be Listed Below (If Applicable).     If you need a refill on your cardiac medications before your next appointment, please call your pharmacy.

## 2016-02-08 LAB — CUP PACEART REMOTE DEVICE CHECK: MDC IDC SESS DTM: 20170825213659

## 2016-02-08 NOTE — Progress Notes (Signed)
Carelink summary report received. Battery status OK. Normal device function. No new symptom episodes, tachy episodes, brady, or pause episodes. No new AF episodes. Monthly summary reports and ROV/PRN 

## 2016-02-10 ENCOUNTER — Ambulatory Visit (INDEPENDENT_AMBULATORY_CARE_PROVIDER_SITE_OTHER): Admitting: *Deleted

## 2016-02-10 DIAGNOSIS — I639 Cerebral infarction, unspecified: Secondary | ICD-10-CM

## 2016-02-10 NOTE — Progress Notes (Signed)
Carelink Summary Report / Loop Recorder 

## 2016-03-10 ENCOUNTER — Ambulatory Visit (INDEPENDENT_AMBULATORY_CARE_PROVIDER_SITE_OTHER): Admitting: *Deleted

## 2016-03-10 DIAGNOSIS — I639 Cerebral infarction, unspecified: Secondary | ICD-10-CM

## 2016-03-11 NOTE — Progress Notes (Signed)
Carelink Summary Report / Loop Recorder 

## 2016-03-26 LAB — CUP PACEART REMOTE DEVICE CHECK
Date Time Interrogation Session: 20170924220945
MDC IDC PG IMPLANT DT: 20160601

## 2016-03-26 NOTE — Progress Notes (Signed)
Carelink summary report received. Battery status OK. Normal device function. No new symptom episodes, brady, or pause episodes. No new AF episodes. 1 tachy- ECG previously reviewed. Monthly summary reports and ROV/PRN 

## 2016-04-07 LAB — CUP PACEART REMOTE DEVICE CHECK
Date Time Interrogation Session: 20171024234121
MDC IDC PG IMPLANT DT: 20160601

## 2016-04-07 NOTE — Progress Notes (Signed)
Carelink summary report received. Battery status OK. Normal device function. No new symptom episodes, tachy episodes, brady, or pause episodes. No new AF episodes. Monthly summary reports and ROV/PRN 

## 2016-04-13 ENCOUNTER — Ambulatory Visit (INDEPENDENT_AMBULATORY_CARE_PROVIDER_SITE_OTHER): Admitting: *Deleted

## 2016-04-13 DIAGNOSIS — I639 Cerebral infarction, unspecified: Secondary | ICD-10-CM

## 2016-04-13 NOTE — Progress Notes (Signed)
Carelink Summary Report / Loop Recorder 

## 2016-04-17 ENCOUNTER — Encounter: Payer: Self-pay | Admitting: Nurse Practitioner

## 2016-05-12 ENCOUNTER — Ambulatory Visit (INDEPENDENT_AMBULATORY_CARE_PROVIDER_SITE_OTHER): Admitting: *Deleted

## 2016-05-12 DIAGNOSIS — I639 Cerebral infarction, unspecified: Secondary | ICD-10-CM | POA: Diagnosis not present

## 2016-05-12 NOTE — Progress Notes (Signed)
Carelink Summary Report / Loop Recorder 

## 2016-05-23 LAB — CUP PACEART REMOTE DEVICE CHECK
Date Time Interrogation Session: 20171124004013
MDC IDC PG IMPLANT DT: 20160601

## 2016-05-23 NOTE — Progress Notes (Signed)
Carelink summary report received. Battery status OK. Normal device function. No new symptom episodes, tachy episodes, brady, or pause episodes. No new AF episodes. Monthly summary reports and ROV/PRN 

## 2016-06-08 ENCOUNTER — Ambulatory Visit (INDEPENDENT_AMBULATORY_CARE_PROVIDER_SITE_OTHER): Admitting: *Deleted

## 2016-06-08 DIAGNOSIS — I639 Cerebral infarction, unspecified: Secondary | ICD-10-CM | POA: Diagnosis not present

## 2016-06-09 NOTE — Progress Notes (Signed)
Carelink Summary Report / Loop Recorder 

## 2016-06-25 ENCOUNTER — Telehealth: Payer: Self-pay | Admitting: Cardiology

## 2016-06-25 NOTE — Telephone Encounter (Signed)
LMOVM requesting that pt send manual transmission b/c home monitor has not updated in at least 14 days.    

## 2016-07-01 ENCOUNTER — Encounter: Payer: Self-pay | Admitting: Cardiology

## 2016-07-02 LAB — CUP PACEART REMOTE DEVICE CHECK
Implantable Pulse Generator Implant Date: 20160601
MDC IDC SESS DTM: 20171224004332

## 2016-07-02 NOTE — Progress Notes (Signed)
Carelink summary report received. Battery status OK. Normal device function. No new symptom episodes, tachy episodes, brady, or pause episodes. No new AF episodes. Monthly summary reports and ROV/PRN 

## 2016-07-03 ENCOUNTER — Ambulatory Visit: Payer: TRICARE For Life (TFL) | Admitting: Nurse Practitioner

## 2016-07-08 ENCOUNTER — Ambulatory Visit (INDEPENDENT_AMBULATORY_CARE_PROVIDER_SITE_OTHER): Admitting: *Deleted

## 2016-07-08 DIAGNOSIS — I639 Cerebral infarction, unspecified: Secondary | ICD-10-CM | POA: Diagnosis not present

## 2016-07-09 NOTE — Progress Notes (Signed)
Carelink Summary Report / Loop Recorder 

## 2016-07-11 LAB — CUP PACEART REMOTE DEVICE CHECK
Implantable Pulse Generator Implant Date: 20160601
MDC IDC SESS DTM: 20180123004155

## 2016-07-28 LAB — CUP PACEART REMOTE DEVICE CHECK
Date Time Interrogation Session: 20180222010858
MDC IDC PG IMPLANT DT: 20160601

## 2016-08-05 ENCOUNTER — Encounter: Payer: Self-pay | Admitting: Nurse Practitioner

## 2016-08-05 ENCOUNTER — Ambulatory Visit (INDEPENDENT_AMBULATORY_CARE_PROVIDER_SITE_OTHER): Payer: TRICARE For Life (TFL) | Admitting: Nurse Practitioner

## 2016-08-05 ENCOUNTER — Encounter (INDEPENDENT_AMBULATORY_CARE_PROVIDER_SITE_OTHER): Payer: Self-pay

## 2016-08-05 VITALS — BP 132/81 | HR 66 | Ht 67.0 in | Wt 203.0 lb

## 2016-08-05 DIAGNOSIS — E785 Hyperlipidemia, unspecified: Secondary | ICD-10-CM

## 2016-08-05 DIAGNOSIS — G4733 Obstructive sleep apnea (adult) (pediatric): Secondary | ICD-10-CM | POA: Diagnosis not present

## 2016-08-05 DIAGNOSIS — I10 Essential (primary) hypertension: Secondary | ICD-10-CM

## 2016-08-05 DIAGNOSIS — Z9989 Dependence on other enabling machines and devices: Secondary | ICD-10-CM

## 2016-08-05 DIAGNOSIS — I639 Cerebral infarction, unspecified: Secondary | ICD-10-CM

## 2016-08-05 NOTE — Progress Notes (Signed)
[facility]  PATIENT: George Armstrong DOB: 05/16/1961   REASON FOR VISIT: Follow-up for stroke HISTORY FROM: Patient and wife    HISTORY OF PRESENT ILLNESS:George Armstrong is an 56 y.o. male who was noted by his wife to start having trouble with his vision on 10/04/14 Thursday evening. He made a comment that he needed to change the lens on his glasses. Had no other complaints that day. On Friday had complaints of dizziness. This was intermittent and by night time went to bed without further complaints. On Saturday complained of a headache that was intermittent. On Sunday the patient had no complaints. On Monday at 0900 became severely dizzy and was having some trouble getting around. He was taken to [facility]. They did a scan that was unremarkable and treated his headache. The patient was very lethargic after his headache treatment and was sent home. He remained lethargic and slept all day on Tuesday. Went to his primary again on Wednesday 10/10/14 and was referred [facility]. BP elevated on arrival. CT head on admission 10/10/14 showed  extensive abnormal attenuation throughout the mid and upper cerebellar hemispheres with relative compression of the fourth ventricle. Early enlargement of the lateral and third ventricles.CT angiogram head and brain showed Extensive acute infarct in the superior cerebellum bilaterally with mass-effect and compression of the fourth ventricle.Bilateral carotid and vertebral arteries widely patent in the neck. No significant intracranial stenosis or mass vascular malformation. 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 MRI brain showed 1.Extensive patchy areas of acute/subacute infarction involving the posterior circulation. This is most prominent within the superior aspect of the cerebellum bilaterally but also involves the posterior left pons, left thalamus, posterior  medial bilateral parietal lobes, and left occipital pole. 2. T2 changes and leptomeningeal enhancement suggest a subacute time frame. 3. There is some hemorrhagic conversion of the superior cerebellar infarcts with blood products noted. 4. Mild periventricular white matter changes are noted otherwise. 5. Mass effect on the cerebellum with partial effacement of the fourth ventricle and quadrigeminal plate cistern. There is no gross herniation.Mrv Head was normal.2D Echocardiogram showed LV  cavity size was normal. Wall thickness wasincreased in a pattern of mild LVH. Systolic function was normal.The estimated ejection fraction was in the range of 55% to 60%.Wall motion was normal; there were no regional wall motion abnormalities. TEE Mild LVH, otherwise normal echo.EKG and Telemetry monitoring showed sinus rhytmn. Hemoglobin A1c was elevated at 8.8. Total cholesterol is elevated 359, triglycerides 115, HDL 55 and LDL cholesterol 281 mg percent. He was started on Lipitor and aspirin. Hypercoagulable panel labs were all normal .Patient had loop recorder inserted to monitor for paroxysmal atrial fibrillation and so far it has not been detected. Patient states his done extremely well since discharge. He is tolerating aspirin well without bleeding or bruising as well as Lipitor without myalgias or arthralgias. He was started on metformin for diabetes and advised to follow-up with his primary physician  He has been using CPAP daily for his sleep apnea. He states he has no residual neurological deficits though he has to be slightly careful with his balance. His speech and dizziness are normal. He in fact wants a letter to go back to work. Update 2/16/2017PS : He returns for follow-up after his last visit the 6 months ago. He continues to do well without recurrent stroke or TIA symptoms. He is tolerating aspirin well without bleeding or bruising. He states his blood  pressure is well controlled and today it is 1-2/77. He  recently had lipid profile and hemoglobin A1c checked of in December last year both of which were fine but I do not have those results. Is tolerating Lipitor well without muscle aches or pains. Patient's fasting sugars range in the 110 range usually. Patient has not decided to participate in the RESPECT ESUS trial and would like to stay on aspirin. He has no physical deficits from his stroke or any complaints. He wants to start riding a motorcycle again but his wife is reluctant. UPDATE 03/21/2018[name], 56 year old male returns for follow-up for stroke event in May 2016. He continues to do well without recurrent stroke or TIA symptoms. He remains on aspirin for secondary stroke prevention with no bruising and bleeding. Blood pressure well controlled in the office today at 132/81. He is on Lipitor for hyperlipidemia and denies myalgias. LDL drawn 04/27/2016 was 84. He has obstructive sleep apnea and uses his CPAP most nights. His compliance is through the [place]. he is also diabetic and says his blood sugars have been in good control usually in the 100-110 range. Hemoglobin A1c 04/27/2016 was 7. Patient works the night shift. He does try to walk for exercise. Loop recorder in place no episodes of atrial fibrillation to date. He returns for reevaluation  REVIEW OF SYSTEMS: Full 14 system review of systems performed and notable only for those listed, all others are neg:  Constitutional: neg  Cardiovascular: neg Ear/Nose/Throat: neg  Skin: neg Eyes: neg Respiratory: neg Gastroitestinal: neg  Hematology/Lymphatic: neg  Endocrine: neg Musculoskeletal:neg Allergy/Immunology: neg Neurological: neg Psychiatric: neg Sleep : Obstructive sleep apnea with CPAP   ALLERGIES: No Known Allergies  HOME MEDICATIONS: Outpatient Medications Prior to Visit  Medication Sig Dispense Refill  . aspirin 325 MG tablet Take 1 tablet (325 mg total) by mouth daily. 30 tablet 12  . atorvastatin (LIPITOR) 80 MG tablet  Take 80 mg by mouth daily.     . hydrochlorothiazide (HYDRODIURIL) 25 MG tablet Take 25 mg by mouth daily.    Marland Kitchen ibuprofen (ADVIL,MOTRIN) 200 MG tablet Take 200 mg by mouth every 6 (six) hours as needed for headache.    . lisinopril (PRINIVIL,ZESTRIL) 20 MG tablet Take 1 tablet (20 mg total) by mouth daily. 30 tablet 2  . metFORMIN (GLUCOPHAGE-XR) 500 MG 24 hr tablet Take 1,000 mg by mouth 2 (two) times daily with a meal.     No facility-administered medications prior to visit.     PAST MEDICAL HISTORY: Past Medical History:  Diagnosis Date  . Diabetes mellitus without complication (HCC)   . Hypertension   . Stroke [facility])     PAST SURGICAL HISTORY: Past Surgical History:  Procedure Laterality Date  . CHOLECYSTECTOMY    . EP IMPLANTABLE DEVICE N/A 10/17/2014   Procedure: Loop Recorder Insertion;  Surgeon: Duke Salvia, MD;  Location: [facility] INVASIVE CV LAB;  Service: Cardiovascular;  Laterality: N/A;  . TEE WITHOUT CARDIOVERSION N/A 10/17/2014   Procedure: TRANSESOPHAGEAL ECHOCARDIOGRAM (TEE);  Surgeon: Thurmon Fair, MD;  Location: [facility] ENDOSCOPY;  Service: Cardiovascular;  Laterality: N/A;    FAMILY HISTORY: Family History  Problem Relation Age of Onset  . Stroke Brother   . Diabetes Brother   . Stroke Mother   . Cancer Father     SOCIAL HISTORY: Social History   Social History  . Marital status: Married    Spouse name: N/A  . Number of children: N/A  . Years of education: N/A  Occupational History  . Not on file.   Social History Main Topics  . Smoking status: Former Games developer  . Smokeless tobacco: Never Used  . Alcohol use Yes     Comment: 2 x / month  . Drug use: Unknown  . Sexual activity: Yes   Other Topics Concern  . Not on file   Social History Narrative  . No narrative on file     PHYSICAL EXAM  Vitals:   08/05/16 0911  BP: 132/81  Pulse: 66  Weight: 203 lb (92.1 kg)  Height: 5\' 7"  (1.702 m)   Body mass index is 31.79 kg/m.  Generalized: Well  developed, Mildly obese male in no acute distress  Head: normocephalic and atraumatic,. Oropharynx benign  Neck: Supple, no carotid bruits  Cardiac: Regular rate rhythm, no murmur  Musculoskeletal: No deformity   Neurological examination   Mentation: Alert oriented to time, place, history taking. Attention span and concentration appropriate. Recent and remote memory intact.  Follows all commands speech and language fluent.   Cranial nerve II-XII: Pupils were equal round reactive to light extraocular movements were full, visual field were full on confrontational test. Facial sensation and strength were normal. hearing was intact to finger rubbing bilaterally. Uvula tongue midline. head turning and shoulder shrug were normal and symmetric.Tongue protrusion into cheek strength was normal. Motor: normal bulk and tone, full strength in the BUE, BLE, fine finger movements normal, no pronator drift. No focal weakness Sensory: normal and symmetric to light touch, pinprick, and  Vibration, in the upper and lower extremities  Coordination: finger-nose-finger, heel-to-shin bilaterally, no dysmetria, no tremor Reflexes: 1+ upper lower and symmetric, plantar responses were flexor bilaterally. Gait and Station: Rising up from seated position without assistance, normal stance,  moderate stride, good arm swing, smooth turning, able to perform tiptoe, and heel walking without difficulty. Tandem gait is steady  DIAGNOSTIC DATA (LABS, IMAGING, TESTING) - I reviewed patient records, labs, notes, testing and imaging myself where available.  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 Patient has done remarkably well with  no deficits. Vascular risk factors of diabetes, hypertension,  hyperlipidemia,  and sleep apnea here to follow-up. The patient is a current patient of Dr. Pearlean Brownie  who is out of the office today . This note is sent to the work in doctor.      PLAN: Continue CPAP  every night as [facility] Keep systolic blood pressure less than 130, today's reading 132/81 continue meds Lipids are followed by PCP continue Lipitor last LDL 04/27/16 was 84 Continue aspirin for  secondary stroke prevention Keep HgbA1C < 6.5 most recent 7.0 on 04/27/16 continue diabetic medications No further stroke or TIA symptoms since  May 2016 If recurrent stroke symptoms occur, call 911 and proceed to the hospital Discharge from neurologic services at this time REMEMBER Pneumonic FAST which stands for  F stands  for face drooping and weakness etc. A stands for arms, weakness S stands for speech slurred  T stands for time to call 911 I spent 25 min  in total face to face time with the patient more than 50% of which was spent counseling and coordination of care, reviewing test results reviewing medications and discussing and reviewing the diagnosis of stroke and importance of risk factor management, Nilda Riggs, GNP,  [facility], APRN  [facility] [address], [place], [place] [address] [phone]

## 2016-08-05 NOTE — Patient Instructions (Signed)
Continue CPAP  every night as ordered  Keep systolic blood pressure less than 130, today's reading 132/81 continue meds Lipids are followed by PCP continue Lipitor Continue aspirin for  secondary stroke prevention No further stroke or TIA symptoms since  May 2016 If recurrent stroke symptoms occur, call 911 and proceed to the hospital Discharge from neurologic services at this time REMEMBER Pneumonic FAST which stands for  F stands  for face drooping and weakness etc. A stands for arms, weakness S stands for speech slurred  T stands for time to call 911

## 2016-08-06 NOTE — Progress Notes (Signed)
I have read the note, and I agree with the clinical assessment and plan.  Richard A. Sater, MD, PhD Certified in Neurology, Clinical Neurophysiology, Sleep Medicine, Pain Medicine and Neuroimaging  [facility] [address], [address] [place], [place] [address] [phone]  

## 2016-08-07 ENCOUNTER — Ambulatory Visit (INDEPENDENT_AMBULATORY_CARE_PROVIDER_SITE_OTHER): Admitting: *Deleted

## 2016-08-07 DIAGNOSIS — I639 Cerebral infarction, unspecified: Secondary | ICD-10-CM | POA: Diagnosis not present

## 2016-08-11 NOTE — Progress Notes (Signed)
Carelink Summary Report / Loop Recorder 

## 2016-08-18 LAB — CUP PACEART REMOTE DEVICE CHECK
Implantable Pulse Generator Implant Date: 20160601
MDC IDC SESS DTM: 20180324014240

## 2016-09-07 ENCOUNTER — Ambulatory Visit (INDEPENDENT_AMBULATORY_CARE_PROVIDER_SITE_OTHER): Admitting: *Deleted

## 2016-09-07 DIAGNOSIS — I639 Cerebral infarction, unspecified: Secondary | ICD-10-CM | POA: Diagnosis not present

## 2016-09-07 NOTE — Progress Notes (Signed)
Carelink Summary Report / Loop Recorder 

## 2016-09-24 LAB — CUP PACEART REMOTE DEVICE CHECK
Date Time Interrogation Session: 20180423013759
MDC IDC PG IMPLANT DT: 20160601

## 2016-10-06 ENCOUNTER — Ambulatory Visit (INDEPENDENT_AMBULATORY_CARE_PROVIDER_SITE_OTHER): Admitting: *Deleted

## 2016-10-06 DIAGNOSIS — I639 Cerebral infarction, unspecified: Secondary | ICD-10-CM | POA: Diagnosis not present

## 2016-10-07 ENCOUNTER — Telehealth: Payer: Self-pay

## 2016-10-07 NOTE — Telephone Encounter (Signed)
Patient called with c/o soreness at ILR site starting "several days ago". He denies redness, swelling or drainage, only that it is tender to touch. Upon inquiry he recalled that he had hit his chest while at work. I advised that the site was probably bruised, encouraged him to take some Tylenol for the pain, and that if he did not subside w/in the week to call me back. He was agreeable to this plan.

## 2016-10-07 NOTE — Progress Notes (Signed)
Carelink Summary Report / Loop Recorder 

## 2016-10-09 LAB — CUP PACEART REMOTE DEVICE CHECK
Date Time Interrogation Session: 20180523021038
MDC IDC PG IMPLANT DT: 20160601

## 2016-11-05 ENCOUNTER — Ambulatory Visit (INDEPENDENT_AMBULATORY_CARE_PROVIDER_SITE_OTHER): Admitting: *Deleted

## 2016-11-05 DIAGNOSIS — I639 Cerebral infarction, unspecified: Secondary | ICD-10-CM

## 2016-11-06 NOTE — Progress Notes (Signed)
Carelink Summary Report / Loop Recorder 

## 2016-11-12 LAB — CUP PACEART REMOTE DEVICE CHECK
Date Time Interrogation Session: 20180622033858
MDC IDC PG IMPLANT DT: 20160601

## 2016-11-12 NOTE — Progress Notes (Signed)
Carelink summary report received. Battery status OK. Normal device function. No new symptom episodes, tachy episodes, brady, or pause episodes. No new AF episodes. Monthly summary reports and ROV/PRN 

## 2016-11-27 ENCOUNTER — Ambulatory Visit
Admission: EM | Admit: 2016-11-27 | Discharge: 2016-11-27 | Disposition: A | Discharge status: Home / Self Care | Payer: Worker's Compensation | Attending: Family Medicine | Admitting: Family Medicine

## 2016-11-27 DIAGNOSIS — S39012A Strain of muscle, fascia and tendon of lower back, initial encounter: Secondary | ICD-10-CM | POA: Diagnosis not present

## 2016-11-27 DIAGNOSIS — X500XXA Overexertion from strenuous movement or load, initial encounter: Secondary | ICD-10-CM | POA: Diagnosis not present

## 2016-11-27 DIAGNOSIS — M545 Low back pain: Secondary | ICD-10-CM

## 2016-11-27 HISTORY — DX: Hyperlipidemia, unspecified: E78.5

## 2016-11-27 MED ORDER — CYCLOBENZAPRINE HCL 10 MG PO TABS: 10.0000 mg | ORAL_TABLET | Freq: Every day | ORAL | 0 refills | Status: DC

## 2016-11-27 MED ORDER — HYDROCODONE-ACETAMINOPHEN 5-325 MG PO TABS: ORAL_TABLET | ORAL | 0 refills | Status: DC

## 2016-11-27 NOTE — ED Triage Notes (Signed)
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  He states he took OTC Aleve and put a heating pad on his lower back to relieve the pain. He states did have some relieve but woke this morning with a very stiff back.

## 2016-12-07 ENCOUNTER — Ambulatory Visit (INDEPENDENT_AMBULATORY_CARE_PROVIDER_SITE_OTHER): Admitting: *Deleted

## 2016-12-07 DIAGNOSIS — I639 Cerebral infarction, unspecified: Secondary | ICD-10-CM | POA: Diagnosis not present

## 2016-12-08 NOTE — Progress Notes (Signed)
Carelink Summary Report / Loop Recorder 

## 2016-12-20 LAB — CUP PACEART REMOTE DEVICE CHECK
Date Time Interrogation Session: 20180722044424
Implantable Pulse Generator Implant Date: 20160601

## 2016-12-20 NOTE — Progress Notes (Signed)
Carelink summary report received. Battery status OK. Normal device function. No new symptom episodes, tachy episodes, brady, or pause episodes. No new AF episodes. Monthly summary reports and ROV/PRN 

## 2017-01-05 ENCOUNTER — Ambulatory Visit (INDEPENDENT_AMBULATORY_CARE_PROVIDER_SITE_OTHER): Admitting: *Deleted

## 2017-01-05 DIAGNOSIS — I639 Cerebral infarction, unspecified: Secondary | ICD-10-CM

## 2017-01-06 NOTE — Progress Notes (Signed)
Carelink Summary Report / Loop Recorder 

## 2017-01-10 LAB — CUP PACEART REMOTE DEVICE CHECK
Date Time Interrogation Session: 20180821054226
MDC IDC PG IMPLANT DT: 20160601

## 2017-02-02 NOTE — ED Provider Notes (Signed)
[facility]    CSN: [id number] Arrival date & time: 11/27/16  1224     History   Chief Complaint Chief Complaint  Patient presents with  . Back Pain    Lower    HPI George Armstrong is a 56 y.o. male.   56 yo male with a c/o low back pain that started last night after lifting, transferring boxes to pallets. Aleve and heating pad gave some relief last night but still hurting. Denies numbness/tingling, bowel or bladder problems.    The history is provided by the patient.  Back Pain    Past Medical History:  Diagnosis Date  . Diabetes mellitus without complication (HCC)   . Hyperlipidemia   . Hypertension   . Stroke [facility])     Patient Active Problem List   Diagnosis Date Noted  . Hyperlipemia 08/05/2016  . OSA on CPAP 08/05/2016  . Essential hypertension 10/17/2014  . Obstructive hydrocephalus   . Cerebellar infarct (HCC) 10/11/2014    Past Surgical History:  Procedure Laterality Date  . CHOLECYSTECTOMY    . EP IMPLANTABLE DEVICE N/A 10/17/2014   Procedure: Loop Recorder Insertion;  Surgeon: Duke Salvia, MD;  Location: [facility] INVASIVE CV LAB;  Service: Cardiovascular;  Laterality: N/A;  . TEE WITHOUT CARDIOVERSION N/A 10/17/2014   Procedure: TRANSESOPHAGEAL ECHOCARDIOGRAM (TEE);  Surgeon: Thurmon Fair, MD;  Location: [facility] ENDOSCOPY;  Service: Cardiovascular;  Laterality: N/A;       Home Medications    Prior to Admission medications   Medication Sig Start Date End Date Taking? Authorizing Provider  aspirin 325 MG tablet Take 1 tablet (325 mg total) by mouth daily. 10/17/14  Yes Layne Benton, NP  atorvastatin (LIPITOR) 80 MG tablet Take 80 mg by mouth daily.    Yes [provider]  hydrochlorothiazide (HYDRODIURIL) 25 MG tablet Take 25 mg by mouth daily.   Yes [provider]  ibuprofen (ADVIL,MOTRIN) 200 MG tablet Take 200 mg by mouth every 6 (six) hours as needed for headache.   Yes [provider]  lisinopril  (PRINIVIL,ZESTRIL) 20 MG tablet Take 1 tablet (20 mg total) by mouth daily. 10/17/14  Yes Layne Benton, NP  metFORMIN (GLUCOPHAGE-XR) 500 MG 24 hr tablet Take 1,000 mg by mouth 2 (two) times daily with a meal.   Yes [provider]  cyclobenzaprine (FLEXERIL) 10 MG tablet Take 1 tablet (10 mg total) by mouth at bedtime. 11/27/16   Payton Mccallum, MD  HYDROcodone-acetaminophen (NORCO/VICODIN) 5-325 MG tablet 1-2 tabs po q 8 hours prn 11/27/16   Payton Mccallum, MD    Family History Family History  Problem Relation Age of Onset  . Stroke Brother   . Diabetes Brother   . Stroke Mother   . Cancer Father     Social History Social History  Substance Use Topics  . Smoking status: Former Games developer  . Smokeless tobacco: Never Used  . Alcohol use Yes     Comment: 2 x / month     Allergies   Patient has no known allergies.   Review of Systems Review of Systems  Musculoskeletal: Positive for back pain.     Physical Exam Triage Vital Signs ED Triage Vitals  Enc Vitals Group     BP 11/27/16 1256 130/86     Pulse Rate 11/27/16 1256 74     Resp 11/27/16 1256 16     Temp 11/27/16 1256 98.5 F (36.9 C)     Temp Source 11/27/16 1256 Oral  SpO2 11/27/16 1256 100 %     Weight 11/27/16 1301 190 lb (86.2 kg)     Height 11/27/16 1301  (1.702 m)     Head Circumference --      Peak Flow --      Pain Score 11/27/16 1301 6     Pain Loc --      Pain Edu? --      Excl. in GC? --    No data found.   Updated Vital Signs BP 130/86 (BP Location: Left Arm)   Pulse 74   Temp 98.5 F (36.9 C) (Oral)   Resp 16   Ht  (1.702 m)   Wt 190 lb (86.2 kg)   SpO2 100%   BMI 29.76 kg/m   Visual Acuity Right Eye Distance:   Left Eye Distance:   Bilateral Distance:    Right Eye Near:   Left Eye Near:    Bilateral Near:     Physical Exam  Constitutional: He appears well-developed and well-nourished. No distress.  Neck: Normal range of motion. Neck supple. No tracheal  deviation present.  Pulmonary/Chest: Effort normal. No stridor. No respiratory distress.  Musculoskeletal:       Lumbar back: He exhibits tenderness and spasm. He exhibits normal range of motion, no bony tenderness, no swelling, no edema, no deformity, no laceration, no pain and normal pulse.  Neurological: He is alert. He has normal reflexes. He displays normal reflexes. He exhibits normal muscle tone. Coordination normal.  Skin: No rash noted. He is not diaphoretic.  Nursing note and vitals reviewed.    UC Treatments / Results  Labs (all labs ordered are listed, but only abnormal results are displayed) Labs Reviewed - No data to display  EKG  EKG Interpretation None       Radiology No results found.  Procedures Procedures (including critical care time)  Medications Ordered in UC Medications - No data to display   Initial Impression / Assessment and Plan / UC Course  I have reviewed the triage vital signs and the nursing notes.  Pertinent labs & imaging results that were available during my care of the patient were reviewed by me and considered in my medical decision making (see chart for details).       Final Clinical Impressions(s) / UC Diagnoses   Final diagnoses:  Strain of lumbar region, initial encounter    New Prescriptions Discharge Medication List as of 11/27/2016  1:53 PM    START taking these medications   Details  cyclobenzaprine (FLEXERIL) 10 MG tablet Take 1 tablet (10 mg total) by mouth at bedtime., Starting Fri 11/27/2016, Normal    HYDROcodone-acetaminophen (NORCO/VICODIN) 5-325 MG tablet 1-2 tabs po q 8 hours prn, Print       1. diagnosis reviewed with patient 2. rx as per orders above; reviewed possible side effects, interactions, risks and benefits  3. Recommend supportive treatment with rest, heat, stretching; work restrictions 4. Follow-up in 1 week at [facility] Occupational health. Controlled Substance Prescriptions [place] Controlled  Substance Registry consulted? No   Payton Mccallum, MD 02/02/17 [phone]

## 2017-02-04 ENCOUNTER — Ambulatory Visit (INDEPENDENT_AMBULATORY_CARE_PROVIDER_SITE_OTHER): Admitting: *Deleted

## 2017-02-04 DIAGNOSIS — I639 Cerebral infarction, unspecified: Secondary | ICD-10-CM

## 2017-02-04 LAB — CUP PACEART REMOTE DEVICE CHECK
Implantable Pulse Generator Implant Date: 20160601
MDC IDC SESS DTM: 20180920074414

## 2017-02-04 NOTE — Progress Notes (Signed)
Carelink Summary Report / Loop Recorder 

## 2017-02-09 ENCOUNTER — Encounter: Payer: Self-pay | Admitting: Internal Medicine

## 2017-02-09 ENCOUNTER — Ambulatory Visit (INDEPENDENT_AMBULATORY_CARE_PROVIDER_SITE_OTHER): Admitting: Internal Medicine

## 2017-02-09 VITALS — BP 122/80 | HR 86 | Ht 67.0 in | Wt 189.0 lb

## 2017-02-09 DIAGNOSIS — I10 Essential (primary) hypertension: Secondary | ICD-10-CM | POA: Diagnosis not present

## 2017-02-09 DIAGNOSIS — I639 Cerebral infarction, unspecified: Secondary | ICD-10-CM

## 2017-02-09 LAB — CUP PACEART INCLINIC DEVICE CHECK
Date Time Interrogation Session: 20180925151656
MDC IDC PG IMPLANT DT: 20160601

## 2017-02-09 NOTE — Patient Instructions (Signed)

## 2017-02-09 NOTE — Progress Notes (Signed)
      Patient Care Team: [name], MD as PCP - General (Family Medicine)   HPI  George Armstrong is a 56 y.o. 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  There have been no palpitations, lightheadedness or syncope.     Records and Results Reviewed Outpt records and labs Past Medical History:  Diagnosis Date  . Diabetes mellitus without complication (HCC)   . Hyperlipidemia   . Hypertension   . Stroke [facility])     Past Surgical History:  Procedure Laterality Date  . CHOLECYSTECTOMY    . EP IMPLANTABLE DEVICE N/A 10/17/2014   Procedure: Loop Recorder Insertion;  Surgeon: Duke Salvia, MD;  Location: [facility] INVASIVE CV LAB;  Service: Cardiovascular;  Laterality: N/A;  . TEE WITHOUT CARDIOVERSION N/A 10/17/2014   Procedure: TRANSESOPHAGEAL ECHOCARDIOGRAM (TEE);  Surgeon: Thurmon Fair, MD;  Location: [facility] ENDOSCOPY;  Service: Cardiovascular;  Laterality: N/A;    Current Outpatient Prescriptions  Medication Sig Dispense Refill  . aspirin 325 MG tablet Take 1 tablet (325 mg total) by mouth daily. 30 tablet 12  . atorvastatin (LIPITOR) 80 MG tablet Take 80 mg by mouth daily.     . hydrochlorothiazide (HYDRODIURIL) 25 MG tablet Take 25 mg by mouth daily.    Marland Kitchen lisinopril (PRINIVIL,ZESTRIL) 20 MG tablet Take 1 tablet (20 mg total) by mouth daily. 30 tablet 2  . metFORMIN (GLUCOPHAGE-XR) 500 MG 24 hr tablet Take 1,000 mg by mouth 2 (two) times daily with a meal.     No current facility-administered medications for this visit.     No Known Allergies    Review of Systems negative except from HPI and PMH  Physical Exam BP 122/80   Pulse 86   Ht  (1.702 m)   Wt 189 lb (85.7 kg)   SpO2 98%   BMI 29.60 kg/m  Well developed and nourished in no acute distress HENT normal Neck supple with JVP-flat Carotids brisk and full  without bruits Clear Regular rate and rhythm, no murmurs or gallops Abd-soft with active BS without hepatomegaly No Clubbing cyanosis edema Skin-warm and dry A & Oriented  Grossly normal sensory and motor function  ECG sinus  16/08/38   Assessment and  Plan  Cryptogenic Stroke  Implantable loop recorder   Diabetes Mellitus   Hypertension    BP well controlled  On statin for secondary stroke prevention   No afib by interrogation    Hgb A1c 7.2  Encouraged exercise and ongoing weight loss  On ACE for B andP and renal protection        Current medicines are reviewed at length with the patient today .  The patient does not  have concerns regarding medicines.

## 2017-03-08 ENCOUNTER — Ambulatory Visit (INDEPENDENT_AMBULATORY_CARE_PROVIDER_SITE_OTHER): Admitting: *Deleted

## 2017-03-08 DIAGNOSIS — I639 Cerebral infarction, unspecified: Secondary | ICD-10-CM

## 2017-03-08 NOTE — Progress Notes (Signed)
Carelink Summary Report / Loop Recorder 

## 2017-03-09 LAB — CUP PACEART REMOTE DEVICE CHECK
Implantable Pulse Generator Implant Date: 20160601
MDC IDC SESS DTM: 20181020074223

## 2017-04-05 ENCOUNTER — Ambulatory Visit (INDEPENDENT_AMBULATORY_CARE_PROVIDER_SITE_OTHER): Admitting: *Deleted

## 2017-04-05 DIAGNOSIS — I639 Cerebral infarction, unspecified: Secondary | ICD-10-CM | POA: Diagnosis not present

## 2017-04-05 NOTE — Progress Notes (Signed)
Carelink Summary Report / Loop Recorder 

## 2017-04-22 LAB — CUP PACEART REMOTE DEVICE CHECK
Date Time Interrogation Session: 20181119113920
Implantable Pulse Generator Implant Date: 20160601

## 2017-05-05 ENCOUNTER — Ambulatory Visit (INDEPENDENT_AMBULATORY_CARE_PROVIDER_SITE_OTHER): Admitting: *Deleted

## 2017-05-05 DIAGNOSIS — I639 Cerebral infarction, unspecified: Secondary | ICD-10-CM | POA: Diagnosis not present

## 2017-05-05 NOTE — Progress Notes (Signed)
Carelink Summary Report / Loop Recorder 

## 2017-05-24 LAB — CUP PACEART REMOTE DEVICE CHECK
Implantable Pulse Generator Implant Date: 20160601
MDC IDC SESS DTM: 20181219134449

## 2017-06-04 ENCOUNTER — Ambulatory Visit (INDEPENDENT_AMBULATORY_CARE_PROVIDER_SITE_OTHER): Admitting: *Deleted

## 2017-06-04 DIAGNOSIS — I639 Cerebral infarction, unspecified: Secondary | ICD-10-CM | POA: Diagnosis not present

## 2017-06-04 NOTE — Progress Notes (Signed)
Carelink Summary Report / Loop Recorder 

## 2017-06-08 LAB — CUP PACEART REMOTE DEVICE CHECK
Date Time Interrogation Session: 20190118153959
MDC IDC PG IMPLANT DT: 20160601

## 2017-07-05 ENCOUNTER — Ambulatory Visit (INDEPENDENT_AMBULATORY_CARE_PROVIDER_SITE_OTHER): Admitting: *Deleted

## 2017-07-05 DIAGNOSIS — I639 Cerebral infarction, unspecified: Secondary | ICD-10-CM

## 2017-07-05 NOTE — Progress Notes (Signed)
Carelink Summary Report / Loop Recorder 

## 2017-08-02 LAB — CUP PACEART REMOTE DEVICE CHECK
MDC IDC PG IMPLANT DT: 20160601
MDC IDC SESS DTM: 20190218160833

## 2017-08-09 ENCOUNTER — Ambulatory Visit (INDEPENDENT_AMBULATORY_CARE_PROVIDER_SITE_OTHER): Admitting: *Deleted

## 2017-08-09 DIAGNOSIS — I639 Cerebral infarction, unspecified: Secondary | ICD-10-CM

## 2017-08-09 NOTE — Progress Notes (Signed)
Carelink Summary Report / Loop Recorder 

## 2017-09-09 ENCOUNTER — Ambulatory Visit (INDEPENDENT_AMBULATORY_CARE_PROVIDER_SITE_OTHER): Admitting: *Deleted

## 2017-09-09 DIAGNOSIS — I639 Cerebral infarction, unspecified: Secondary | ICD-10-CM

## 2017-09-10 NOTE — Progress Notes (Signed)
Carelink Summary Report / Loop Recorder 

## 2017-09-14 ENCOUNTER — Telehealth: Payer: Self-pay | Admitting: *Deleted

## 2017-09-14 NOTE — Telephone Encounter (Signed)
Spoke w/ pt and informed him of RRT. Pt stated that he wanted to have loop recorder removed. Informed him a scheduler will call to schedule an appt w/ SK to discuss removal and that we will send him a return kit to send his monitor back to [employer]. Pt verbalized understanding.

## 2017-09-14 NOTE — Telephone Encounter (Signed)
LMOVM requesting call back, gave DC phone number.

## 2017-09-14 NOTE — Telephone Encounter (Signed)
LMOVM requesting call back to the [facility].  LINQ at RRT as of 09/13/17.  Will offer appointment with Dr. Graciela Husbands to discuss explant.

## 2017-09-14 NOTE — Telephone Encounter (Signed)
Follow Up:      Returning your call from today. 

## 2017-09-15 ENCOUNTER — Encounter: Payer: Self-pay | Admitting: Internal Medicine

## 2017-09-15 LAB — CUP PACEART REMOTE DEVICE CHECK
Date Time Interrogation Session: 20190323153956
MDC IDC PG IMPLANT DT: 20160601

## 2017-10-05 LAB — CUP PACEART REMOTE DEVICE CHECK
MDC IDC PG IMPLANT DT: 20160601
MDC IDC SESS DTM: 20190425160927

## 2017-10-06 ENCOUNTER — Ambulatory Visit (INDEPENDENT_AMBULATORY_CARE_PROVIDER_SITE_OTHER): Admitting: Internal Medicine

## 2017-10-06 VITALS — BP 130/80 | HR 85 | Ht 67.0 in | Wt 194.0 lb

## 2017-10-06 DIAGNOSIS — I639 Cerebral infarction, unspecified: Secondary | ICD-10-CM | POA: Diagnosis not present

## 2017-10-06 DIAGNOSIS — I10 Essential (primary) hypertension: Secondary | ICD-10-CM

## 2017-10-06 NOTE — Progress Notes (Signed)
      Patient Care Team: [name], MD as PCP - General (Family Medicine)   HPI  George Armstrong is a 57 y.o. male Seen in followup for ILR implanted 8/16 for Cryptogenic Stroke  8/16  Echo EF normal    Date Cr K  12/18 31.0 3. 6        The patient denies chest pain, shortness of breath, nocturnal dyspnea, orthopnea or peripheral edema.  There have been no palpitations, lightheadedness or syncope.      Records and Results Reviewed Outpt records and labs Past Medical History:  Diagnosis Date  . Diabetes mellitus without complication (HCC)   . Hyperlipidemia   . Hypertension   . Stroke [facility])     Past Surgical History:  Procedure Laterality Date  . CHOLECYSTECTOMY    . EP IMPLANTABLE DEVICE N/A 10/17/2014   Procedure: Loop Recorder Insertion;  Surgeon: Duke Salvia, MD;  Location: [facility] INVASIVE CV LAB;  Service: Cardiovascular;  Laterality: N/A;  . TEE WITHOUT CARDIOVERSION N/A 10/17/2014   Procedure: TRANSESOPHAGEAL ECHOCARDIOGRAM (TEE);  Surgeon: Thurmon Fair, MD;  Location: [facility] ENDOSCOPY;  Service: Cardiovascular;  Laterality: N/A;    Current Outpatient Medications  Medication Sig Dispense Refill  . aspirin 325 MG tablet Take 1 tablet (325 mg total) by mouth daily. 30 tablet 12  . atorvastatin (LIPITOR) 80 MG tablet Take 80 mg by mouth daily.     . hydrochlorothiazide (HYDRODIURIL) 25 MG tablet Take 25 mg by mouth daily.    Marland Kitchen lisinopril (PRINIVIL,ZESTRIL) 20 MG tablet Take 1 tablet (20 mg total) by mouth daily. 30 tablet 2  . metFORMIN (GLUCOPHAGE-XR) 500 MG 24 hr tablet Take 1,000 mg by mouth 2 (two) times daily with a meal.     No current facility-administered medications for this visit.     No Known Allergies    Review of Systems negative except from HPI and PMH  Physical Exam BP 130/80   Pulse 85   Ht  (1.702 m)   Wt 194 lb (88 kg)   SpO2 98%   BMI 30.38 kg/m  Well developed and nourished in no acute distress HENT normal Neck supple  with JVP-flat Clear Regular rate and rhythm, no murmurs or gallops Abd-soft with active BS No Clubbing cyanosis edema Skin-warm and dry A & Oriented  Grossly normal sensory and motor function   ECG personally reviewed sinus @ 76 17/08/38   Assessment and  Plan  Cryptogenic Stroke  Implantable loop recorder   Diabetes Mellitus   Hypertension   BP well controlled  Statin for secondary prevention  ASA 325>>81  ILR at EOS  Discussed removall not sure what he wants to do    0 We spent more than 50% of our >25 min visit in face to face counseling regarding the above    Current medicines are reviewed at length with the patient today .  The patient does not  have concerns regarding medicines.

## 2017-10-06 NOTE — Patient Instructions (Signed)
Medication Instructions:  Your physician recommends that you continue on your current medications as directed. Please refer to the Current Medication list given to you today.  Labwork: None ordered.  Testing/Procedures: None ordered.  Follow-Up: Your physician recommends that you schedule a follow-up appointment as needed  Any Other Special Instructions Will Be Listed Below (If Applicable).  Give [place] a call if you decide you would like to explant your Linq.   If you need a refill on your cardiac medications before your next appointment, please call your pharmacy.

## 2017-10-12 LAB — CUP PACEART INCLINIC DEVICE CHECK
Implantable Pulse Generator Implant Date: 20160601
MDC IDC SESS DTM: 20190522204852

## 2017-12-02 ENCOUNTER — Other Ambulatory Visit: Payer: Self-pay | Admitting: Internal Medicine

## 2019-11-13 ENCOUNTER — Ambulatory Visit
Admission: EM | Admit: 2019-11-13 | Discharge: 2019-11-13 | Disposition: A | Discharge status: Home / Self Care | Payer: Worker's Compensation | Attending: Family Medicine | Admitting: Family Medicine

## 2019-11-13 ENCOUNTER — Other Ambulatory Visit: Payer: Self-pay

## 2019-11-13 DIAGNOSIS — S39012A Strain of muscle, fascia and tendon of lower back, initial encounter: Secondary | ICD-10-CM

## 2019-11-13 MED ORDER — MELOXICAM 15 MG PO TABS: 15.0000 mg | ORAL_TABLET | Freq: Every day | ORAL | 0 refills | Status: AC | PRN

## 2019-11-13 MED ORDER — TIZANIDINE HCL 4 MG PO TABS: 4.0000 mg | ORAL_TABLET | Freq: Four times a day (QID) | ORAL | 0 refills | Status: AC | PRN

## 2019-11-13 NOTE — ED Provider Notes (Signed)
[facility]    CSN: [id number] Arrival date & time: 11/13/19  1257      History   Chief Complaint Chief Complaint  Patient presents with  . Back Pain  . Worker's Compensation   HPI  59 year old male presents with back pain.  This is a Designer, multimedia. injury.  Patient reports that he injured his back while at work on Tuesday.  Patient states that he was lifting heavy containers and then subsequently developed low back pain.  Patient reports right-sided low back pain with radiation to the buttocks.  Seems to be worse with activity particular when he ambulates.  No pain at rest.  Patient reports that his pain is 7/10 in severity.  No other associated symptoms.  No other complaints.  Past Medical History:  Diagnosis Date  . Diabetes mellitus without complication (HCC)   . Hyperlipidemia   . Hypertension   . Stroke [facility])     Patient Active Problem List   Diagnosis Date Noted  . Hyperlipemia 08/05/2016  . OSA on CPAP 08/05/2016  . Essential hypertension 10/17/2014  . Obstructive hydrocephalus (HCC)   . Cerebellar infarct (HCC) 10/11/2014    Past Surgical History:  Procedure Laterality Date  . CHOLECYSTECTOMY    . EP IMPLANTABLE DEVICE N/A 10/17/2014   Procedure: Loop Recorder Insertion;  Surgeon: Duke Salvia, MD;  Location: [facility] INVASIVE CV LAB;  Service: Cardiovascular;  Laterality: N/A;  . TEE WITHOUT CARDIOVERSION N/A 10/17/2014   Procedure: TRANSESOPHAGEAL ECHOCARDIOGRAM (TEE);  Surgeon: Thurmon Fair, MD;  Location: [facility] ENDOSCOPY;  Service: Cardiovascular;  Laterality: N/A;       Home Medications    Prior to Admission medications   Medication Sig Start Date End Date Taking? Authorizing Provider  aspirin 325 MG tablet Take 1 tablet (325 mg total) by mouth daily. 10/17/14   Layne Benton, NP  atorvastatin (LIPITOR) 80 MG tablet Take 80 mg by mouth daily.     [provider]  hydrochlorothiazide (HYDRODIURIL) 25 MG tablet Take 25 mg by mouth  daily.    [provider]  lisinopril (PRINIVIL,ZESTRIL) 20 MG tablet Take 1 tablet (20 mg total) by mouth daily. 10/17/14   Layne Benton, NP  meloxicam (MOBIC) 15 MG tablet Take 1 tablet (15 mg total) by mouth daily as needed. 11/13/19   Tommie Sams, DO  metFORMIN (GLUCOPHAGE-XR) 500 MG 24 hr tablet Take 1,000 mg by mouth 2 (two) times daily with a meal.    [provider]  tiZANidine (ZANAFLEX) 4 MG tablet Take 1 tablet (4 mg total) by mouth every 6 (six) hours as needed for muscle spasms. 11/13/19   Tommie Sams, DO    Family History Family History  Problem Relation Age of Onset  . Stroke Brother   . Diabetes Brother   . Stroke Mother   . Cancer Father     Social History Social History   Tobacco Use  . Smoking status: Former Games developer  . Smokeless tobacco: Never Used  Vaping Use  . Vaping Use: Never used  Substance Use Topics  . Alcohol use: Yes    Comment: 2 x / month  . Drug use: No     Allergies   Patient has no known allergies.   Review of Systems Review of Systems  Constitutional: Negative.   Musculoskeletal: Positive for back pain.   Physical Exam Triage Vital Signs ED Triage Vitals  Enc Vitals Group     BP 11/13/19 1409 (!) 124/91  Pulse Rate 11/13/19 1409 87     Resp 11/13/19 1409 16     Temp 11/13/19 1409 98.2 F (36.8 C)     Temp Source 11/13/19 1409 Oral     SpO2 --      Weight 11/13/19 1412 182 lb (82.6 kg)     Height 11/13/19 1412 5\' 7"  (1.702 m)     Head Circumference --      Peak Flow --      Pain Score 11/13/19 1409 7     Pain Loc --      Pain Edu? --      Excl. in GC? --    Updated Vital Signs BP (!) 124/91 (BP Location: Right Arm)   Pulse 87   Temp 98.2 F (36.8 C) (Oral)   Resp 16   Ht 5\' 7"  (1.702 m)   Wt 82.6 kg   BMI 28.51 kg/m   Visual Acuity Right Eye Distance:   Left Eye Distance:   Bilateral Distance:    Right Eye Near:   Left Eye Near:    Bilateral Near:     Physical Exam Vitals and  nursing note reviewed.  Constitutional:      General: She is not in acute distress.    Appearance: Normal appearance. She is not ill-appearing.  HENT:     Head: Normocephalic and atraumatic.  Eyes:     General:        Right eye: No discharge.        Left eye: No discharge.     Conjunctiva/sclera: Conjunctivae normal.  Cardiovascular:     Rate and Rhythm: Normal rate and regular rhythm.  Pulmonary:     Effort: Pulmonary effort is normal.     Breath sounds: Normal breath sounds. No wheezing, rhonchi or rales.  Musculoskeletal:     Comments: Right paraspinal musculature tenderness to palpation.  Neurological:     Mental Status: She is alert.  Psychiatric:        Mood and Affect: Mood normal.        Behavior: Behavior normal.    UC Treatments / Results  Labs (all labs ordered are listed, but only abnormal results are displayed) Labs Reviewed - No data to display  EKG   Radiology No results found.  Procedures Procedures (including critical care time)  Medications Ordered in UC Medications - No data to display  Initial Impression / Assessment and Plan / UC Course  I have reviewed the triage vital signs and the nursing notes.  Pertinent labs & imaging results that were available during my care of the patient were reviewed by me and considered in my medical decision making (see chart for details).    59 year old male presents with lumbar strain.  Workmen's Comp. form filled out.  Treating with meloxicam and Zanaflex.  Final Clinical Impressions(s) / UC Diagnoses   Final diagnoses:  Strain of lumbar region, initial encounter     Discharge Instructions     Rest.  Heat.  Medication as prescribed.  Take care  Dr.    ED Prescriptions    Medication Sig Dispense Auth. Provider   meloxicam (MOBIC) 15 MG tablet Take 1 tablet (15 mg total) by mouth daily as needed. 30 tablet Zionah Criswell G, DO   tiZANidine (ZANAFLEX) 4 MG tablet Take 1 tablet (4 mg total) by  mouth every 6 (six) hours as needed for muscle spasms. 30 tablet Adriana Simas, DO     PDMP not reviewed  this encounter.   Coral Spikes, [place] 11/13/19 2049

## 2019-11-13 NOTE — ED Triage Notes (Signed)
Pt states he hurt his right low back at the [employer] while lifting boxes.  Right low back and into right buttocks.  Hurts and grabs when he walks. Doesn't bother him at rest

## 2019-11-13 NOTE — Discharge Instructions (Signed)
Rest.  Heat.  Medication as prescribed.  Take care  Dr. Yvett Rossel  

## 2019-12-11 ENCOUNTER — Other Ambulatory Visit: Payer: Self-pay | Admitting: Family Medicine

## 2022-08-11 ENCOUNTER — Other Ambulatory Visit: Payer: Self-pay

## 2022-08-11 DIAGNOSIS — Z0000 Encounter for general adult medical examination without abnormal findings: Secondary | ICD-10-CM

## 2022-08-11 DIAGNOSIS — E7800 Pure hypercholesterolemia, unspecified: Secondary | ICD-10-CM

## 2022-08-11 DIAGNOSIS — Z9189 Other specified personal risk factors, not elsewhere classified: Secondary | ICD-10-CM

## 2022-08-21 ENCOUNTER — Inpatient Hospital Stay: Admission: RE | Admit: 2022-08-21 | Source: Ambulatory Visit

## 2022-10-25 ENCOUNTER — Emergency Department (HOSPITAL_BASED_OUTPATIENT_CLINIC_OR_DEPARTMENT_OTHER)
Admission: EM | Admit: 2022-10-25 | Discharge: 2022-10-25 | Disposition: A | Discharge status: Home / Self Care | Attending: Emergency Medicine | Admitting: Emergency Medicine

## 2022-10-25 ENCOUNTER — Other Ambulatory Visit: Payer: Self-pay

## 2022-10-25 ENCOUNTER — Encounter (HOSPITAL_BASED_OUTPATIENT_CLINIC_OR_DEPARTMENT_OTHER): Payer: Self-pay

## 2022-10-25 DIAGNOSIS — Z7982 Long term (current) use of aspirin: Secondary | ICD-10-CM | POA: Diagnosis not present

## 2022-10-25 DIAGNOSIS — L03114 Cellulitis of left upper limb: Secondary | ICD-10-CM | POA: Diagnosis not present

## 2022-10-25 DIAGNOSIS — R21 Rash and other nonspecific skin eruption: Secondary | ICD-10-CM | POA: Diagnosis present

## 2022-10-25 MED ORDER — CEPHALEXIN 500 MG PO CAPS: 500.0000 mg | ORAL_CAPSULE | Freq: Three times a day (TID) | ORAL | 0 refills | Status: AC

## 2022-10-25 NOTE — ED Provider Notes (Signed)
[place] EMERGENCY DEPARTMENT AT [facility] Provider Note   CSN: [id number] Arrival date & time: 10/25/22  1003     History  Chief Complaint  Patient presents with   Rash    George Armstrong is a 62 y.o. adult.  He is complaining of a tender area on his inner elbow on the left that started a few days ago.  He wonders if he may have been bitten by a spider.  He does not recall any other trauma.  York Spaniel it has been getting progressively worse and extending.  No fevers nausea vomiting.  No numbness or weakness.  He is right-hand dominant.  The history is provided by the patient.  Arm Injury Location:  Elbow Elbow location:  L elbow Injury: no (? spider bite)   Pain details:    Quality:  Aching   Onset quality:  Sudden   Duration:  4 days   Timing:  Constant   Progression:  Worsening Handedness:  Right-handed Dislocation: no   Relieved by:  Nothing Worsened by:  Movement Ineffective treatments: benedryl. Associated symptoms: no decreased range of motion, no fever, no numbness and no tingling        Home Medications Prior to Admission medications   Medication Sig Start Date End Date Taking? Authorizing Provider  aspirin 325 MG tablet Take 1 tablet (325 mg total) by mouth daily. 10/17/14   Layne Benton, NP  atorvastatin (LIPITOR) 80 MG tablet Take 80 mg by mouth daily.     [provider]  hydrochlorothiazide (HYDRODIURIL) 25 MG tablet Take 25 mg by mouth daily.    [provider]  lisinopril (PRINIVIL,ZESTRIL) 20 MG tablet Take 1 tablet (20 mg total) by mouth daily. 10/17/14   Layne Benton, NP  meloxicam (MOBIC) 15 MG tablet Take 1 tablet (15 mg total) by mouth daily as needed. 11/13/19   Tommie Sams, DO  metFORMIN (GLUCOPHAGE-XR) 500 MG 24 hr tablet Take 1,000 mg by mouth 2 (two) times daily with a meal.    [provider]  tiZANidine (ZANAFLEX) 4 MG tablet Take 1 tablet (4 mg total) by mouth every 6 (six) hours as needed for muscle  spasms. 11/13/19   Tommie Sams, DO      Allergies    Patient has no known allergies.    Review of Systems   Review of Systems  Constitutional:  Negative for fever.    Physical Exam Updated Vital Signs BP 136/89 (BP Location: Right Arm)   Pulse 74   Temp 98.2 F (36.8 C) (Oral)   Resp 18   Ht 5\' 7"  (1.702 m)   Wt 86.2 kg   SpO2 99%   BMI 29.76 kg/m  Physical Exam Constitutional:      Appearance: Normal appearance. She is well-developed.  HENT:     Head: Normocephalic and atraumatic.  Eyes:     Conjunctiva/sclera: Conjunctivae normal.  Musculoskeletal:        General: Tenderness present. No deformity. Normal range of motion.     Cervical back: Neck supple.     Comments: Left elbow normal landmarks full range of motion.  There is a tender area on his medial elbow that extends a little bit onto the proximal forearm.  Seems to be associated with the vein there with some firmness.  No obvious skin breaks.  No erythema or warmth.  Skin:    General: Skin is warm and dry.     Capillary Refill: Capillary refill  takes less than 2 seconds.  Neurological:     General: No focal deficit present.     Mental Status: She is alert.     GCS: GCS eye subscore is 4. GCS verbal subscore is 5. GCS motor subscore is 6.     Sensory: No sensory deficit.     Motor: No weakness.     ED Results / Procedures / Treatments   Labs (all labs ordered are listed, but only abnormal results are displayed) Labs Reviewed - No data to display  EKG None  Radiology No results found.  Procedures Procedures    Medications Ordered in ED Medications - No data to display  ED Course/ Medical Decision Making/ A&P                             Medical Decision Making Risk Prescription drug management.   This patient complains of left inner elbow pain; this involves an extensive number of treatment Options and is a complaint that carries with it a high risk of complications and morbidity. The  differential includes bite, cellulitis, tendinitis, thrombophlebitis Additional history obtained from patient significant other Previous records obtained and reviewed no recent admissions Social determinants considered, no significant barriers Critical Interventions: None  After the interventions stated above, I reevaluated the patient and found patient to be well-appearing in no distress Admission and further testing considered, no indications for admission or further workup at this time.  Will cover with antibiotics but likely some element of thrombophlebitis.  Return instructions discussed         Final Clinical Impression(s) / ED Diagnoses Final diagnoses:  Cellulitis of left elbow    Rx / DC Orders ED Discharge Orders          Ordered    cephALEXin (KEFLEX) 500 MG capsule  3 times daily        10/25/22 1029              Terrilee Files, MD 10/25/22 1643

## 2022-10-25 NOTE — Discharge Instructions (Signed)
You were seen in the emergency department for possible bite to your left elbow area.  On exam you had a tender vein.  There could be some early signs of infection so we are starting you on some antibiotics.  You can also do warm compress to the area and ibuprofen.  Return to the emergency department or follow-up with your doctor if not improving in the next few days.

## 2022-10-25 NOTE — ED Triage Notes (Signed)
"  Think a spider or something may have bit me in the bend of my left elbow yesterday, red and hurts when I straighten my arm" per pt

## 2022-11-02 ENCOUNTER — Ambulatory Visit
Admission: RE | Admit: 2022-11-02 | Discharge: 2022-11-02 | Disposition: A | Discharge status: Home / Self Care | Source: Ambulatory Visit | Attending: Physician Assistant | Admitting: Physician Assistant

## 2022-11-02 ENCOUNTER — Other Ambulatory Visit: Payer: Self-pay | Admitting: Physician Assistant

## 2022-11-02 DIAGNOSIS — M79602 Pain in left arm: Secondary | ICD-10-CM | POA: Diagnosis present

## 2023-03-15 ENCOUNTER — Encounter: Payer: Self-pay | Admitting: Physical Therapy

## 2023-03-15 ENCOUNTER — Other Ambulatory Visit: Payer: Self-pay

## 2023-03-15 ENCOUNTER — Ambulatory Visit: Payer: No Typology Code available for payment source | Attending: Internal Medicine | Admitting: Physical Therapy

## 2023-03-15 VITALS — BP 125/86 | HR 80

## 2023-03-15 DIAGNOSIS — M5459 Other low back pain: Secondary | ICD-10-CM | POA: Insufficient documentation

## 2023-03-15 NOTE — Therapy (Signed)
OUTPATIENT PHYSICAL THERAPY THORACOLUMBAR EVALUATION   Patient Name: George Armstrong MRN: [id number] DOB:1960/09/28, 62 y.o., adult Today's Date: 03/15/2023  END OF SESSION:  PT End of Session - 03/15/23 1404     Visit Number 1    Number of Visits 6    Date for PT Re-Evaluation 05/10/23    Authorization Type Veteran's Admission    PT Start Time 1402    PT Stop Time 1445    PT Time Calculation (min) 43 min    Equipment Utilized During Treatment Gait belt    Activity Tolerance Patient tolerated treatment well    Behavior During Therapy WFL for tasks assessed/performed             Past Medical History:  Diagnosis Date   Diabetes mellitus without complication (HCC)    Hyperlipidemia    Hypertension    Stroke [facility])    Past Surgical History:  Procedure Laterality Date   CHOLECYSTECTOMY     EP IMPLANTABLE DEVICE N/A 10/17/2014   Procedure: Loop Recorder Insertion;  Surgeon: Duke Salvia, MD;  Location: [facility] INV[facility] LAB;  Service: Cardiovascular;  Laterality: N/A;   TEE WITHOUT CARDIOVERSION N/A 10/17/2014   Procedure: TRANSESOPHAGEAL ECHOCARDIOGRAM (TEE);  Surgeon: Thurmon Fair, MD;  Location[facility] ENDOSCOPY;  Service: Cardiovascular;  Laterality: N/A;   Patient Active Problem List   Diagnosis Date Noted   Hyperlipemia 08/05/2016   OSA on CPAP 08/05/2016   Essential hypertension 10/17/2014   Obstructive hydrocephalus (HCC)    Cerebellar infarct (HCC) 10/11/2014    PCP: [name], MD  [facility]  REFERRING DIAG: M54.51 (ICD-10-CM) - Vertebrogenic low back pain  Rationale for Evaluation and Treatment: Rehabilitation  THERAPY DIAG:  Other low back pain  ONSET DATE: 03/09/2023 (referral date)  SUBJECTIVE:                                                                                                                                                                                           SUBJECTIVE STATEMENT: Patient  reports that back has been hurting quite bad. Patient reports that he gets radiating pain down both legs R > L. Patient reports that he feels most of the pain in the glutes. Patient reports onset of pain around 2 years ago when lifting really heavy objects at work. Patient reports that he got a brace on leg which helped limit pain. Patient also tried some stretches for back but will little improvement and finally decided to try PT. Patient reports that typically his back feels better sitting down. Patient reports that picking up anything heavy makes back feel worse. Patient  ambulates without an AD. Denies bowel and bladder changes.  PERTINENT HISTORY:  7 strokes per patient (6 small ones maybe TIAs, 1 major stroke) last stroke was in 2015, post-traumatic stress disorder, diabetes   PAIN:  Are you having pain? Yes: NPRS scale: 8-9 this morning, currently 5-6/10 Pain location: glutes down into legs Pain description: sharp Aggravating factors: picking heavy objects, lot of walking Relieving factors: sitting   PRECAUTIONS: None  RED FLAGS: None   WEIGHT BEARING RESTRICTIONS: No  FALLS:  Has patient fallen in last 6 months? No  LIVING ENVIRONMENT: Lives with: lives with their family Lives in: House/apartment Stairs: Yes: Internal: 14-15 steps; on right going up and External: 1 steps; none Has following equipment at home: None  OCCUPATION: Works as Press photographer   PLOF: Independent  PATIENT GOALS: "Try to ease my pain off so that I can have a regular life."   NEXT MD VISIT: 5 more months from now at [place]  OBJECTIVE:  Note: Objective measures were completed at Evaluation unless otherwise noted.  DIAGNOSTIC FINDINGS:   X-Ray at [facility] for back but results are not available   IMPRESSION CT Head WO Contrast 10/15/2014:  Evolving bilateral cerebellar infarcts with extensive edema resulting in mild hydrocephalus, unchanged. LEFT thalamus infarct better seen on prior MRI. No lobar hematoma  or new acute large vascular territory infarct.  PATIENT SURVEYS:  Modified Oswestry  15/50 = 30% impairment    SCREENING FOR RED FLAGS: Bowel or bladder incontinence: No Spinal tumors: No Cauda equina syndrome: No Compression fracture: No Abdominal aneurysm: No  COGNITION: Overall cognitive status: Within functional limits for tasks assessed     SENSATION: WFL  POSTURE: rounded shoulders and forward head  PALPATION: Tender with CPAs to L1-L3 no change with repeated motions  LUMBAR ROM:   AROM eval  Flexion WFL - radiates pain down the R side, reports would rather do this more, mild increase with repeated motions    Extension WFL - no change with repeated motions with pain  Right lateral flexion WFL - mild pain on R side  Left lateral flexion WFL - mild on R side  Right rotation WFL   Left rotation WFL   (Blank rows = not tested)  LOWER EXTREMITY ROM:      Grossly WFL with   LOWER EXTREMITY MMT:    MMT Right eval Left eval  Hip flexion 4-/5 painful 5/5  Hip extension    Hip abduction 4-/5 4-/5  Hip adduction 4-/5 4-/5  Hip internal rotation 4-/5 4-/5  Hip external rotation 4-/5 4-/5  Knee flexion 4-/5 4-/5  Knee extension 4-/5 4-/5  Ankle dorsiflexion 4/5 4/5  Ankle plantarflexion    Ankle inversion    Ankle eversion     (Blank rows = not tested)  LUMBAR SPECIAL TESTS:  See above for repeated motions   FUNCTIONAL TESTS:    [facility] PT Assessment - 03/15/23 0001       Standardized Balance Assessment   Standardized Balance Assessment Five Times Sit to Stand    Five times sit to stand comments  21.82   seconds without UE support (SBA), reports increase in pain 5/10 to 7/10            TODAY'S TREATMENT:  Initial Eval only   PATIENT EDUCATION:  Education details: Provided late to work slip, POC, examination findings, goal  collaboration Person educated: Patient Education method: Chief Technology Officer Education comprehension: verbalized understanding  HOME EXERCISE PROGRAM: To be provided  ASSESSMENT:  CLINICAL IMPRESSION: Patient is a 62 y.o. male who was seen today for physical therapy evaluation and treatment for low back pain with radicular symptoms RLE > LLE. Patient screened for hip involvement but was negative in today's session though gross LE and hip weakness likely contributing to deficits. Patient no clear directional preference in today's session so recommend further trial to determine best approach. Patient presents with functional LE weakness as indicated by 5xSTS results and moderate impairment indicated by modified ODI. Patient will benefit from skilled physical therapy services to progress towards PLOF and maximize activity tolerance such as walking > 1/2 mile and lifting heavier objects.    OBJECTIVE IMPAIRMENTS: decreased strength and pain.   ACTIVITY LIMITATIONS: carrying, lifting, bending, transfers, and locomotion level  PARTICIPATION LIMITATIONS: shopping, occupation, and yard work  PERSONAL FACTORS: Time since onset of injury/illness/exacerbation and 1-2 comorbidities: see above  are also affecting patient's functional outcome.   REHAB POTENTIAL: Good  CLINICAL DECISION MAKING: Stable/uncomplicated  EVALUATION COMPLEXITY: Low   GOALS: Goals reviewed with patient? Yes  SHORT TERM GOALS: Target date: 04/05/2023  Patient will demonstrate 100% compliance with initial HEP to continue to progress between physical therapy sessions.   Baseline: To be provided  Goal status: INITIAL  2.  Patient will improve their 5x Sit to Stand score to less than 18 seconds to demonstrate a decreased risk for falls and improved LE strength.   Baseline: 21.82 seconds without UE use Goal status: INITIAL  LONG TERM GOALS: Target date: 04/26/2023  Patient will report demonstrate independence with  final HEP in order to maintain current gains and continue to progress after physical therapy discharge.   Baseline: To be provided  Goal status: INITIAL  2.  Patient will improve their 5x Sit to Stand score to less than 15 seconds to demonstrate a decreased risk for falls and improved LE strength.   Baseline: 21.82 seconds without UE use Goal status: INITIAL  3.  Patient will improve modified ODI score to 20% impairment or less to indicate a clinically important improvement in low back pain.   Baseline: 15/50 = 30% Impairment  Goal status: INITIAL  PLAN:  PT FREQUENCY: 1x/week  PT DURATION: 6 weeks  PLANNED INTERVENTIONS: 97164- PT Re-evaluation, 97110-Therapeutic exercises, 97530- Therapeutic activity, [id number]- Neuromuscular re-education, 97535- Self Care, [id number]- Manual therapy, [id number]- Gait training, [phone]- Aquatic Therapy, and Dry Needling.  PLAN FOR NEXT SESSION: provide initial HEP with emphasis on hip strengthening and pain management strategies (trial flexion versus extension based lumbar exercises), lifting mechanics    Carmelia Bake, PT, DTP  03/15/2023, 3:28 PM

## 2023-03-25 ENCOUNTER — Ambulatory Visit: Payer: No Typology Code available for payment source | Attending: Internal Medicine | Admitting: Physical Therapy

## 2023-03-25 ENCOUNTER — Encounter: Payer: Self-pay | Admitting: Physical Therapy

## 2023-03-25 VITALS — BP 130/91 | HR 90

## 2023-03-25 DIAGNOSIS — M5459 Other low back pain: Secondary | ICD-10-CM | POA: Diagnosis present

## 2023-03-25 NOTE — Therapy (Signed)
OUTPATIENT PHYSICAL THERAPY THORACOLUMBAR TREATMENT   Patient Name: George Armstrong MRN: [id number] DOB:1961/01/08, 62 y.o., adult Today's Date: 03/25/2023  END OF SESSION:  PT End of Session - 03/25/23 1319     Visit Number 2    Number of Visits 6    Date for PT Re-Evaluation 05/10/23    Authorization Type Veteran's Admission    PT Start Time 1319    PT Stop Time 1400    PT Time Calculation (min) 41 min    Equipment Utilized During Treatment Gait belt    Activity Tolerance Patient tolerated treatment well    Behavior During Therapy WFL for tasks assessed/performed             Past Medical History:  Diagnosis Date   Diabetes mellitus without complication (HCC)    Hyperlipidemia    Hypertension    Stroke [facility])    Past Surgical History:  Procedure Laterality Date   CHOLECYSTECTOMY     EP IMPLANTABLE DEVICE N/A 10/17/2014   Procedure: Loop Recorder Insertion;  Surgeon: Duke Salvia, MD;  Location: [facility] INVAS[facility] CV LAB;  Service: Cardiovascular;  Laterality: N/A;   TEE WITHOUT CARDIOVERSION N/A 10/17/2014   Procedure: TRANSESOPHAGEAL ECHOCARDIOGRAM (TEE);  Surgeon: Thurmon Fair, MD;  Location: [facility] ENDOSCOPY;  Service: Cardiovascular;  Laterality: N/A;   Patient Active Problem List   Diagnosis Date Noted   Hyperlipemia 08/05/2016   OSA on CPAP 08/05/2016   Essential hypertension 10/17/2014   Obstructive hydrocephalus (HCC)    Cerebellar infarct (HCC) 10/11/2014    PCP: [name], MD  [facility]  REFERRING DIAG: M54.51 (ICD-10-CM) - Vertebrogenic low back pain  Rationale for Evaluation and Treatment: Rehabilitation  THERAPY DIAG:  Other low back pain  ONSET DATE: 03/09/2023 (referral date)  SUBJECTIVE:                                                                                                                                                                                           SUBJECTIVE STATEMENT: Patient reports  that his back is currently 5/10. Patient reports that it typically feels better once he is moving around. Denies other acute changes/falls near falls.   PERTINENT HISTORY:  7 strokes per patient (6 small ones maybe TIAs, 1 major stroke) last stroke was in 2015, post-traumatic stress disorder, diabetes   PAIN:  Are you having pain? Yes: NPRS scale: 5/10 Pain location: glutes down into legs Pain description: sharp Aggravating factors: picking heavy objects, lot of walking Relieving factors: sitting   PRECAUTIONS: None  RED FLAGS: None   WEIGHT BEARING RESTRICTIONS: No  FALLS:  Has  patient fallen in last 6 months? No  LIVING ENVIRONMENT: Lives with: lives with their family Lives in: House/apartment Stairs: Yes: Internal: 14-15 steps; on right going up and External: 1 steps; none Has following equipment at home: None  OCCUPATION: Works as Press photographer   PLOF: Independent  PATIENT GOALS: "Try to ease my pain off so that I can have a regular life."   NEXT MD VISIT: 5 more months from now at [place]  OBJECTIVE:  Note: Objective measures were completed at Evaluation unless otherwise noted.  DIAGNOSTIC FINDINGS:   [facility]Ray at [facility] for back but results are not available   IMPRESSION CT Head WO Contrast 10/15/2014:  Evolving bilateral cerebellar infarcts with extensive edema resulting in mild hydrocephalus, unchanged. LEFT thalamus infarct better seen on prior MRI. No lobar hematoma or new acute large vascular territory infarct.  PATIENT SURVEYS:  Modified Oswestry  15/50 = 30% impairment    TODAY'S TREATMENT:                                                                                                                               Vitals:   03/25/23 1324  BP: (!) 130/91  Pulse: 90   TherEx:  Extension Based exercise trial: Prone pressup in R roadkill - x10 reports gradual increase in pain without centralization Prone pressup in neutral x 5 - continues to report gradual  increase in pain without centralization Cat cow - reports pain with cow (extension) x 5  Flexion based exercise trial:  - Supine Posterior Pelvic Tilt  - 10 reps - 5 second hold - Supine Single Knee to Chest Stretch  2 sets - 30 seconds hold - Supine Figure 4 Piriformis Stretch  1 set - 30 seconds hold - Seated Piriformis Stretch with Trunk Bend - 3 sets - 30 seconds hold - Half Deadlift with 10lb dumbbells bilaterally - 3 sets - 10 reps - Supine Dead Bug with Leg Extension - 3 sets - 10 reps  PATIENT EDUCATION:  Education details: Initial HEP  Person educated: Patient Education method: Explanation and Handouts Education comprehension: verbalized understanding  HOME EXERCISE PROGRAM: Access Code: [email or web address] URL: [email or web address] Date: 03/25/2023 Prepared by: Maryruth Eve  Exercises - Supine Posterior Pelvic Tilt  - 1-2 x daily - 7 x weekly - 2 sets - 10 reps - 5 second hold - Supine Single Knee to Chest Stretch  - 1-2 x daily - 7 x weekly - 3 sets - 30 seconds hold - Supine Figure 4 Piriformis Stretch  - 1-2 x daily - 7 x weekly - 3 sets - 30 seconds hold - Seated Piriformis Stretch with Trunk Bend  - 1-2 x daily - 7 x weekly - 3 sets - 30 seconds hold - Half Deadlift with 10 lb dumbbells bilaterally  - 1 x daily - 3 x weekly - 3 sets - 10 reps - Supine Dead Bug with Leg Extension  - 1 x daily -  3 x weekly - 3 sets - 10 reps  ASSESSMENT:  CLINICAL IMPRESSION: Skilled PT session emphasized trial both of extension and flexion based exercises as patient showed no preference on eval. Presentation in today's session consistent for flexion preference so provided updated HEP with flexion based activities. Also introduced basics of dead lift for future reinforcement for lifting mechanics. Patient reporting no increase in pain at end of session. Continue POC.   OBJECTIVE IMPAIRMENTS: decreased strength and pain.   ACTIVITY LIMITATIONS: carrying, lifting, bending,  transfers, and locomotion level  PARTICIPATION LIMITATIONS: shopping, occupation, and yard work  PERSONAL FACTORS: Time since onset of injury/illness/exacerbation and 1-2 comorbidities: see above  are also affecting patient's functional outcome.   REHAB POTENTIAL: Good  CLINICAL DECISION MAKING: Stable/uncomplicated  EVALUATION COMPLEXITY: Low   GOALS: Goals reviewed with patient? Yes  SHORT TERM GOALS: Target date: 04/05/2023  Patient will demonstrate 100% compliance with initial HEP to continue to progress between physical therapy sessions.   Baseline: To be provided  Goal status: INITIAL  2.  Patient will improve their 5x Sit to Stand score to less than 18 seconds to demonstrate a decreased risk for falls and improved LE strength.   Baseline: 21.82 seconds without UE use Goal status: INITIAL  LONG TERM GOALS: Target date: 04/26/2023  Patient will report demonstrate independence with final HEP in order to maintain current gains and continue to progress after physical therapy discharge.   Baseline: To be provided  Goal status: INITIAL  2.  Patient will improve their 5x Sit to Stand score to less than 15 seconds to demonstrate a decreased risk for falls and improved LE strength.   Baseline: 21.82 seconds without UE use Goal status: INITIAL  3.  Patient will improve modified ODI score to 20% impairment or less to indicate a clinically important improvement in low back pain.   Baseline: 15/50 = 30% Impairment  Goal status: INITIAL  PLAN:  PT FREQUENCY: 1x/week  PT DURATION: 6 weeks  PLANNED INTERVENTIONS: 97164- PT Re-evaluation, 97110-Therapeutic exercises, 97530- Therapeutic activity, [id number]- Neuromuscular re-education, 97535- Self Care, [id number]- Manual therapy, [id number]- Gait training, [phone]- Aquatic Therapy, and Dry Needling.  PLAN FOR NEXT SESSION: progress lifting mechanics, flexion based activities and strengthening, work on glute engagement and greatest stretch,  possible trial of downward dog or child's pose    Carmelia Bake, PT, DTP  03/25/2023, 2:15 PM

## 2023-04-02 ENCOUNTER — Ambulatory Visit: Payer: No Typology Code available for payment source | Admitting: Physical Therapy

## 2023-04-08 ENCOUNTER — Encounter: Payer: Self-pay | Admitting: Physical Therapy

## 2023-04-08 ENCOUNTER — Ambulatory Visit: Payer: No Typology Code available for payment source | Admitting: Physical Therapy

## 2023-04-08 VITALS — BP 127/88 | HR 70

## 2023-04-08 DIAGNOSIS — M5459 Other low back pain: Secondary | ICD-10-CM | POA: Diagnosis not present

## 2023-04-08 NOTE — Therapy (Signed)
OUTPATIENT PHYSICAL THERAPY THORACOLUMBAR TREATMENT   Patient Name: George Armstrong MRN: [id number] DOB:Oct 12, 1960, 62 y.o., adult Today's Date: 04/08/2023  END OF SESSION:  PT End of Session - 04/08/23 1322     Visit Number 3    Number of Visits 6    Date for PT Re-Evaluation 05/10/23    Authorization Type Veteran's Admission    PT Start Time 1322    PT Stop Time 1400    PT Time Calculation (min) 38 min    Equipment Utilized During Treatment Gait belt    Activity Tolerance Patient tolerated treatment well    Behavior During Therapy WFL for tasks assessed/performed             Past Medical History:  Diagnosis Date   Diabetes mellitus without complication (HCC)    Hyperlipidemia    Hypertension    Stroke [facility])    Past Surgical History:  Procedure Laterality Date   CHOLECYSTECTOMY     EP IMPLANTABLE DEVICE N/A 10/17/2014   Procedure: Loop Recorder Insertion;  Surgeon: Duke Salvia, MD;  Location: [facility] INVASIVE CV LAB;  Service: Cardiovascular;  Laterality: N/A;   TEE WITHOUT CARDIOVERSION N/A 10/17/2014   Procedure: TRANSESOPHAGEAL ECHOCARDIOGRAM (TEE);  Surgeon: Thurmon Fair, MD;  Location: [facility] ENDOSCOPY;  Service: Cardiovascular;  Laterality: N/A;   Patient Active Problem List   Diagnosis Date Noted   Hyperlipemia 08/05/2016   OSA on CPAP 08/05/2016   Essential hypertension 10/17/2014   Obstructive hydrocephalus (HCC)    Cerebellar infarct (HCC) 10/11/2014    PCP: [name], MD  [facility]  REFERRING DIAG: M54.51 (ICD-10-CM) - Vertebrogenic low back pain  Rationale for Evaluation and Treatment: Rehabilitation  THERAPY DIAG:  Other low back pain  ONSET DATE: 03/09/2023 (referral date)  SUBJECTIVE:                                                                                                                                                                                           SUBJECTIVE STATEMENT: Patient  reports that he has been working on exercises at home; he reports that he is still noticing challenges in the morning but does get better as he is moving around. Denies other acute changes/falls near falls.   PERTINENT HISTORY:  7 strokes per patient (6 small ones maybe TIAs, 1 major stroke) last stroke was in 2015, post-traumatic stress disorder, diabetes   PAIN:  Are you having pain? Yes: NPRS scale: 4-5/10 Pain location: glutes down into legs Pain description: sharp Aggravating factors: picking heavy objects, lot of walking Relieving factors: sitting   PRECAUTIONS: None  RED FLAGS:  None   WEIGHT BEARING RESTRICTIONS: No  FALLS:  Has patient fallen in last 6 months? No  LIVING ENVIRONMENT: Lives with: lives with their family Lives in: House/apartment Stairs: Yes: Internal: 14-15 steps; on right going up and External: 1 steps; none Has following equipment at home: None  OCCUPATION: Works as Press photographer   PLOF: Independent  PATIENT GOALS: "Try to ease my pain off so that I can have a regular life."   NEXT MD VISIT: 5 more months from now at [place]  OBJECTIVE:  Note: Objective measures were completed at Evaluation unless otherwise noted.  DIAGNOSTIC FINDINGS:   X-Ray at [facility] for back but results are not available   IMPRESSION CT Head WO Contrast 10/15/2014:  Evolving bilateral cerebellar infarcts with extensive edema resulting in mild hydrocephalus, unchanged. LEFT thalamus infarct better seen on prior MRI. No lobar hematoma or new acute large vascular territory infarct.  PATIENT SURVEYS:  Modified Oswestry  15/50 = 30% impairment    TODAY'S TREATMENT:                                                                                                                               Vitals:   04/08/23 1326  BP: 127/88  Pulse: 70   TherEx: - The Greatest stretch in world (includes hip flexor, trunk rotation in half kneel, and hamstring stretch) 2x bil  - Hip hinge x  10 - Dead lift with hip hinge and surge tank 2 x 10 reps - Seated on piriformis stretch 2 x 30 seconds  - Foam roller to glutes x 60 seconds bil  *Updated HEP as noted below  PATIENT EDUCATION:  Education details: Continue HEP + updates Person educated: Patient Education method: Explanation and Handouts Education comprehension: verbalized understanding  HOME EXERCISE PROGRAM: Access Code: [email or web address] URL: [email or web address] Date: 03/25/2023 Prepared by: Maryruth Eve  Exercises - Supine Posterior Pelvic Tilt  - 1-2 x daily - 7 x weekly - 2 sets - 10 reps - 5 second hold - Supine Single Knee to Chest Stretch  - 1-2 x daily - 7 x weekly - 3 sets - 30 seconds hold - Supine Figure 4 Piriformis Stretch  - 1-2 x daily - 7 x weekly - 3 sets - 30 seconds hold - Seated Piriformis Stretch with Trunk Bend  - 1-2 x daily - 7 x weekly - 3 sets - 30 seconds hold - Half Deadlift with 10 lb dumbbells bilaterally  - 1 x daily - 3 x weekly - 3 sets - 10 reps - Supine Dead Bug with Leg Extension  - 1 x daily - 3 x weekly - 3 sets - 10 reps - Piriformis Mobilization on Foam Roll  - 1 x daily - 7 x weekly - 1 sets - 2 minutes  hold - Greatest stretch 2x each side daily  ASSESSMENT:  CLINICAL IMPRESSION: Skilled PT session emphasized progression of exercises started on eval with emphasis on  glute, piriformis, and hip mobility to help manage pain. Patient with notable hamstring tightness that likely contributing to lumbar symptoms. Continue POC.   OBJECTIVE IMPAIRMENTS: decreased strength and pain.   ACTIVITY LIMITATIONS: carrying, lifting, bending, transfers, and locomotion level  PARTICIPATION LIMITATIONS: shopping, occupation, and yard work  PERSONAL FACTORS: Time since onset of injury/illness/exacerbation and 1-2 comorbidities: see above  are also affecting patient's functional outcome.   REHAB POTENTIAL: Good  CLINICAL DECISION MAKING: Stable/uncomplicated  EVALUATION  COMPLEXITY: Low   GOALS: Goals reviewed with patient? Yes  SHORT TERM GOALS: Target date: 04/05/2023  Patient will demonstrate 100% compliance with initial HEP to continue to progress between physical therapy sessions.   Baseline: To be provided  Goal status: INITIAL  2.  Patient will improve their 5x Sit to Stand score to less than 18 seconds to demonstrate a decreased risk for falls and improved LE strength.   Baseline: 21.82 seconds without UE use Goal status: INITIAL  LONG TERM GOALS: Target date: 04/26/2023  Patient will report demonstrate independence with final HEP in order to maintain current gains and continue to progress after physical therapy discharge.   Baseline: To be provided  Goal status: INITIAL  2.  Patient will improve their 5x Sit to Stand score to less than 15 seconds to demonstrate a decreased risk for falls and improved LE strength.   Baseline: 21.82 seconds without UE use Goal status: INITIAL  3.  Patient will improve modified ODI score to 20% impairment or less to indicate a clinically important improvement in low back pain.   Baseline: 15/50 = 30% Impairment  Goal status: INITIAL  PLAN:  PT FREQUENCY: 1x/week  PT DURATION: 6 weeks  PLANNED INTERVENTIONS: 97164- PT Re-evaluation, 97110-Therapeutic exercises, 97530- Therapeutic activity, [id number]- Neuromuscular re-education, 97535- Self Care, [id number]- Manual therapy, [id number]- Gait training, [phone]- Aquatic Therapy, and Dry Needling.  PLAN FOR NEXT SESSION: progress lifting mechanics, flexion based activities and strengthening, review greatest stretch, possible trial of downward dog or child's pose    Carmelia Bake, PT, DTP  04/08/2023, 2:51 PM

## 2023-04-12 ENCOUNTER — Ambulatory Visit: Payer: No Typology Code available for payment source | Admitting: Physical Therapy

## 2023-04-12 ENCOUNTER — Encounter: Payer: Self-pay | Admitting: Physical Therapy

## 2023-04-12 VITALS — BP 127/84 | HR 79

## 2023-04-12 DIAGNOSIS — M5459 Other low back pain: Secondary | ICD-10-CM

## 2023-04-12 NOTE — Therapy (Signed)
OUTPATIENT PHYSICAL THERAPY THORACOLUMBAR TREATMENT   Patient Name: George Armstrong MRN: [id number] DOB:1961-02-03, 62 y.o., adult Today's Date: 04/12/2023  END OF SESSION:  PT End of Session - 04/12/23 1235     Visit Number 4    Number of Visits 6    Date for PT Re-Evaluation 05/10/23    Authorization Type Veteran's Admission    PT Start Time 1233    PT Stop Time 1315    PT Time Calculation (min) 42 min    Equipment Utilized During Treatment Gait belt    Activity Tolerance Patient tolerated treatment well    Behavior During Therapy WFL for tasks assessed/performed             Past Medical History:  Diagnosis Date   Diabetes mellitus without complication (HCC)    Hyperlipidemia    Hypertension    Stroke [facility])    Past Surgical History:  Procedure Laterality Date   CHOLECYSTECTOMY     EP IMPLANTABLE DEVICE N/A 10/17/2014   Procedure: Loop Recorder Insertion;  Surgeon: Duke Salvia, MD;  Location: [facility] INVAS[facility] CV LAB;  Service: Cardiovascular;  Laterality: N/A;   TEE WITHOUT CARDIOVERSION N/A 10/17/2014   Procedure: TRANSESOPHAGEAL ECHOCARDIOGRAM (TEE);  Surgeon: Thurmon Fair, MD;  Location: [facility] ENDOSCOPY;  Service: Cardiovascular;  Laterality: N/A;   Patient Active Problem List   Diagnosis Date Noted   Hyperlipemia 08/05/2016   OSA on CPAP 08/05/2016   Essential hypertension 10/17/2014   Obstructive hydrocephalus (HCC)    Cerebellar infarct (HCC) 10/11/2014    PCP: [name], MD  [facility]  REFERRING DIAG: M54.51 (ICD-10-CM) - Vertebrogenic low back pain  Rationale for Evaluation and Treatment: Rehabilitation  THERAPY DIAG:  Other low back pain  ONSET DATE: 03/09/2023 (referral date)  SUBJECTIVE:                                                                                                                                                                                           SUBJECTIVE STATEMENT: Patient  reports that he is doing good; wasn't too sore after last session. Denies other acute changes/falls near falls.   Arrives to session with significant other - Felicia   PERTINENT HISTORY:  7 strokes per patient (6 small ones maybe TIAs, 1 major stroke) last stroke was in 2015, post-traumatic stress disorder, diabetes   PAIN:  Are you having pain? Yes: NPRS scale: 3-4/10 Pain location: glutes down into legs Pain description: sharp Aggravating factors: picking heavy objects, lot of walking Relieving factors: sitting   PRECAUTIONS: None  RED FLAGS: None   WEIGHT BEARING RESTRICTIONS: No  FALLS:  Has patient fallen in last 6 months? No  LIVING ENVIRONMENT: Lives with: lives with their family Lives in: House/apartment Stairs: Yes: Internal: 14-15 steps; on right going up and External: 1 steps; none Has following equipment at home: None  OCCUPATION: Works as Press photographer   PLOF: Independent  PATIENT GOALS: "Try to ease my pain off so that I can have a regular life."   NEXT MD VISIT: 5 more months from now at [place]  OBJECTIVE:  Note: Objective measures were completed at Evaluation unless otherwise noted.  DIAGNOSTIC FINDINGS:   X-Ray at [facility] for back but results are not available   IMPRESSION CT Head WO Contrast 10/15/2014:  Evolving bilateral cerebellar infarcts with extensive edema resulting in mild hydrocephalus, unchanged. LEFT thalamus infarct better seen on prior MRI. No lobar hematoma or new acute large vascular territory infarct.  PATIENT SURVEYS:  Modified Oswestry  15/50 = 30% impairment    TODAY'S TREATMENT:                                                                                                                               Vitals:   04/12/23 1240  BP: 127/84  Pulse: 79    TherAct:  [facility] PT Assessment - 04/12/23 0001       Standardized Balance Assessment   Standardized Balance Assessment Five Times Sit to Stand    Five times sit to stand  comments  16.09   seconds without UE use (reports pain 3/10)            Assessed vitals as noted above seated on RUE; discussed positioning at night including provided a printout of for recommendation for sidelying positioning in the evening; discussed typical pillows versus as specific designed pillows like pregnancy pillow to trial   TherEx: - The Greatest stretch in world (includes hip flexor, trunk rotation in half kneel, and hamstring stretch) 2x bil  - Forward flexion stretch with swiss ball seated (blue ball) 1 x 5 forward, 1 x 5 each direction  - Child's pose 1 x 5 forward, 1 x 5 each direction  - Hamstring stretch 2 x 30 seconds  *Updated HEP as noted below  PATIENT EDUCATION:  Education details: Continue HEP + night positioning Person educated: Patient Education method: Explanation and Handouts Education comprehension: verbalized understanding  HOME EXERCISE PROGRAM: Access Code: [email or web address] URL: [email or web address] Date: 04/12/2023 Prepared by: Maryruth Eve  Exercises - Supine Posterior Pelvic Tilt  - 1-2 x daily - 7 x weekly - 2 sets - 10 reps - 5 second hold - Supine Single Knee to Chest Stretch  - 1-2 x daily - 7 x weekly - 3 sets - 30 seconds hold - Supine Figure 4 Piriformis Stretch  - 1-2 x daily - 7 x weekly - 3 sets - 30 seconds hold - Seated Piriformis Stretch with Trunk Bend  - 1-2 x daily - 7 x weekly - 3 sets - 30  seconds hold - Half Deadlift with Kettlebell  - 1 x daily - 3 x weekly - 3 sets - 10 reps - Seated Table Hamstring Stretch  - 1 x daily - 7 x weekly - 3 sets - 30 seconds hold - Supine Dead Bug with Leg Extension  - 1 x daily - 3 x weekly - 3 sets - 10 reps - Piriformis Mobilization on Foam Roll  - 1 x daily - 7 x weekly - 1 sets - 2 minutes  hold - Child's Pose Stretch  - 1 x daily - 7 x weekly - 3 sets - 10 reps - Child's Pose with Sidebending  - 1 x daily - 7 x weekly - 3 sets - 10 reps  ASSESSMENT:  CLINICAL  IMPRESSION: Skilled PT session emphasized assessment of STGs. Progress limited by min compliance with HEP but reviewed in today's session importance of compliance and questions about HEP. Patient achieved STG on 5xSTS and progressing well towards LTGs. Finished session with review of sleeping positioning to minimize pain. Continue POC.   OBJECTIVE IMPAIRMENTS: decreased strength and pain.   ACTIVITY LIMITATIONS: carrying, lifting, bending, transfers, and locomotion level  PARTICIPATION LIMITATIONS: shopping, occupation, and yard work  PERSONAL FACTORS: Time since onset of injury/illness/exacerbation and 1-2 comorbidities: see above  are also affecting patient's functional outcome.   REHAB POTENTIAL: Good  CLINICAL DECISION MAKING: Stable/uncomplicated  EVALUATION COMPLEXITY: Low   GOALS: Goals reviewed with patient? Yes  SHORT TERM GOALS: Target date: 04/05/2023  Patient will demonstrate 100% compliance with initial HEP to continue to progress between physical therapy sessions.   Baseline: To be provided, reports need to review exercises Goal status: NOT MET  2.  Patient will improve their 5x Sit to Stand score to less than 18 seconds to demonstrate a decreased risk for falls and improved LE strength.   Baseline: 21.82 seconds without UE use; 16.09 seconds without UE use reports pain 3/10 Goal status: MET   LONG TERM GOALS: Target date: 04/26/2023  Patient will report demonstrate independence with final HEP in order to maintain current gains and continue to progress after physical therapy discharge.   Baseline: To be provided  Goal status: INITIAL  2.  Patient will improve their 5x Sit to Stand score to less than 15 seconds to demonstrate a decreased risk for falls and improved LE strength.   Baseline: 21.82 seconds without UE use; 16.09 seconds without UE use reports pain 3/10 Goal status: IN PROGRESS  3.  Patient will improve modified ODI score to 20% impairment or less to  indicate a clinically important improvement in low back pain.   Baseline: 15/50 = 30% Impairment  Goal status: INITIAL  PLAN:  PT FREQUENCY: 1x/week  PT DURATION: 6 weeks  PLANNED INTERVENTIONS: 97164- PT Re-evaluation, 97110-Therapeutic exercises, 97530- Therapeutic activity, [id number]- Neuromuscular re-education, 97535- Self Care, 16109- Manual therapy, [id number]- Gait training, [phone]- Aquatic Therapy, and Dry Needling.  PLAN FOR NEXT SESSION: progress lifting mechanics, flexion based activities and strengthening, review greatest stretch, possible trial of downward dog is sleep positioning helping    Carmelia Bake, PT, DTP  04/12/2023, 1:25 PM

## 2023-04-22 ENCOUNTER — Ambulatory Visit: Payer: No Typology Code available for payment source | Admitting: Physical Therapy

## 2023-04-30 ENCOUNTER — Ambulatory Visit: Payer: No Typology Code available for payment source | Admitting: Physical Therapy

## 2023-05-06 ENCOUNTER — Ambulatory Visit: Payer: No Typology Code available for payment source | Attending: Internal Medicine | Admitting: Physical Therapy

## 2023-05-06 ENCOUNTER — Encounter: Payer: Self-pay | Admitting: Physical Therapy

## 2023-05-06 VITALS — BP 128/82 | HR 84

## 2023-05-06 DIAGNOSIS — M5459 Other low back pain: Secondary | ICD-10-CM | POA: Diagnosis present

## 2023-05-06 NOTE — Therapy (Signed)
OUTPATIENT PHYSICAL THERAPY THORACOLUMBAR TREATMENT / DISCHARGE  PHYSICAL THERAPY DISCHARGE SUMMARY  Visits from Start of Care: 5  Current functional level related to goals / functional outcomes: See below   Remaining deficits: Ongoing pain improves with stretches   Education / Equipment: Continue HEP + follow up with PCP if symptoms are not improved   Patient agrees to discharge. Patient goals were partially met. Patient is being discharged due to being pleased with the current functional level.   Patient Name: George Armstrong MRN: [id number] DOB:[birth year], 62 y.o., adult Today's Date: 05/06/2023  END OF SESSION:  PT End of Session - 05/06/23 1152     Visit Number 5    Number of Visits 6    Date for PT Re-Evaluation 05/10/23    Authorization Type Veteran's Admission    PT Start Time 1149    PT Stop Time 1215    PT Time Calculation (min) 26 min    Equipment Utilized During Treatment Gait belt    Activity Tolerance Patient tolerated treatment well    Behavior During Therapy WFL for tasks assessed/performed             Past Medical History:  Diagnosis Date   Diabetes mellitus without complication (HCC)    Hyperlipidemia    Hypertension    Stroke [facility])    Past Surgical History:  Procedure Laterality Date   CHOLECYSTECTOMY     EP IMPLANTABLE DEVICE N/A 10/17/2014   Procedure: Loop Recorder Insertion;  Surgeon: Duke Salvia, MD;  Location: [facility] INVASIVE CV LAB;  Service: Cardiovascular;  Laterality: N/A;   TEE WITHOUT CARDIOVERSION N/A 10/17/2014   Procedure: TRANSESOPHAGEAL ECHOCARDIOGRAM (TEE);  Surgeon: Thurmon Fair, MD;  Location: [facility] ENDOSCOPY;  Service: Cardiovascular;  Laterality: N/A;   Patient Active Problem List   Diagnosis Date Noted   Hyperlipemia 08/05/2016   OSA on CPAP 08/05/2016   Essential hypertension 10/17/2014   Obstructive hydrocephalus (HCC)    Cerebellar infarct (HCC) 10/11/2014    PCP: [name], MD  [facility] PROVID[facility]  REFERRING DIAG: M54.51 (ICD-10-CM) - Vertebrogenic low back pain  Rationale for Evaluation and Treatment: Rehabilitation  THERAPY DIAG:  Other low back pain  ONSET DATE: 03/09/2023 (referral date)  SUBJECTIVE:                                                                                                                                                                                           SUBJECTIVE STATEMENT: Patient reports that he a ramp from [employer] that fell on his leg, patient was able to prevent a fall but was a little injured -  no head injuries. Patient reports that he is ready for this to be last PT session.   Arrives to session with significant other - Felicia   PERTINENT HISTORY:  7 strokes per patient (6 small ones maybe TIAs, 1 major stroke) last stroke was in 2015, post-traumatic stress disorder, diabetes   PAIN:  Are you having pain? Yes: NPRS scale: 4/10 Pain location: glutes down into legs Pain description: sharp Aggravating factors: picking heavy objects, lot of walking Relieving factors: sitting   PRECAUTIONS: None  RED FLAGS: None   WEIGHT BEARING RESTRICTIONS: No  FALLS:  Has patient fallen in last 6 months? No  LIVING ENVIRONMENT: Lives with: lives with their family Lives in: House/apartment Stairs: Yes: Internal: 14-15 steps; on right going up and External: 1 steps; none Has following equipment at home: None  OCCUPATION: Works as Press photographer   PLOF: Independent  PATIENT GOALS: "Try to ease my pain off so that I can have a regular life."   NEXT MD VISIT: 5 more months from now at [place]  OBJECTIVE:  Note: Objective measures were completed at Evaluation unless otherwise noted.  DIAGNOSTIC FINDINGS:   X-Ray at [facility] for back but results are not available   IMPRESSION CT Head WO Contrast 10/15/2014:  Evolving bilateral cerebellar infarcts with extensive edema resulting in mild hydrocephalus, unchanged. LEFT thalamus  infarct better seen on prior MRI. No lobar hematoma or new acute large vascular territory infarct.  PATIENT SURVEYS:  Modified Oswestry  15/50 = 30% impairment    TODAY'S TREATMENT:                                                                                                                               Vitals:   05/06/23 1158  BP: 128/82  Pulse: 84   Seated on LUE   TherAct:  [facility] PT Assessment - 05/06/23 0001       Standardized Balance Assessment   Standardized Balance Assessment Five Times Sit to Stand    Five times sit to stand comments  11.22   seconds without UE use (3/10 in low back)            [facility] PT Assessment - 05/06/23 0001       Standardized Balance Assessment   Standardized Balance Assessment Five Times Sit to Stand    Five times sit to stand comments  11.22   seconds without UE use (3/10 in low back)           Modified Oswestry Disability Index: 28/50 = 56% Impairment  Verbally reviewed HEP - patient reports no questions at this time and does not need new printout   PATIENT EDUCATION:  Education details: Continue HEP + Discharge from PT Person educated: Patient Education method: Explanation and Handouts Education comprehension: verbalized understanding  HOME EXERCISE PROGRAM: Access Code: [email or web address] URL: [email or web address] Date: 04/12/2023 Prepared by: Maryruth Eve  Exercises - Supine Posterior Pelvic Tilt  - 1-2 x  daily - 7 x weekly - 2 sets - 10 reps - 5 second hold - Supine Single Knee to Chest Stretch  - 1-2 x daily - 7 x weekly - 3 sets - 30 seconds hold - Supine Figure 4 Piriformis Stretch  - 1-2 x daily - 7 x weekly - 3 sets - 30 seconds hold - Seated Piriformis Stretch with Trunk Bend  - 1-2 x daily - 7 x weekly - 3 sets - 30 seconds hold - Half Deadlift with Kettlebell  - 1 x daily - 3 x weekly - 3 sets - 10 reps - Seated Table Hamstring Stretch  - 1 x daily - 7 x weekly - 3 sets - 30 seconds hold - Supine Dead  Bug with Leg Extension  - 1 x daily - 3 x weekly - 3 sets - 10 reps - Piriformis Mobilization on Foam Roll  - 1 x daily - 7 x weekly - 1 sets - 2 minutes  hold - Child's Pose Stretch  - 1 x daily - 7 x weekly - 3 sets - 10 reps - Child's Pose with Sidebending  - 1 x daily - 7 x weekly - 3 sets - 10 reps  ASSESSMENT:  CLINICAL IMPRESSION: Patient being D/C from PT due to maximized rehab potential. Demonstrated significant improvement on 5xSTS but regression on modified oswestry. Patient reports improvements when completing stretches. Patient agreeable to D/C as satisfied with progress at this time.  OBJECTIVE IMPAIRMENTS: decreased strength and pain.   ACTIVITY LIMITATIONS: carrying, lifting, bending, transfers, and locomotion level  PARTICIPATION LIMITATIONS: shopping, occupation, and yard work  PERSONAL FACTORS: Time since onset of injury/illness/exacerbation and 1-2 comorbidities: see above  are also affecting patient's functional outcome.   REHAB POTENTIAL: Good  CLINICAL DECISION MAKING: Stable/uncomplicated  EVALUATION COMPLEXITY: Low   GOALS: Goals reviewed with patient? Yes  SHORT TERM GOALS: Target date: 04/05/2023  Patient will demonstrate 100% compliance with initial HEP to continue to progress between physical therapy sessions.   Baseline: To be provided, reports need to review exercises Goal status: NOT MET  2.  Patient will improve their 5x Sit to Stand score to less than 18 seconds to demonstrate a decreased risk for falls and improved LE strength.   Baseline: 21.82 seconds without UE use; 16.09 seconds without UE use reports pain 3/10 Goal status: MET   LONG TERM GOALS: Target date: 04/26/2023  Patient will report demonstrate independence with final HEP in order to maintain current gains and continue to progress after physical therapy discharge.   Baseline: To be provided, reports confidence in home program  Goal status: MET  2.  Patient will improve their 5x  Sit to Stand score to less than 15 seconds to demonstrate a decreased risk for falls and improved LE strength.   Baseline: 21.82 seconds without UE use; 16.09 seconds without UE use reports pain 3/10; improved to 11.22 second without UE support Goal status: MET  3.  Patient will improve modified ODI score to 20% impairment or less to indicate a clinically important improvement in low back pain.   Baseline: 15/50 = 30% Impairment; 28/50 = 56% Impairment Goal status: NOT MET  PLAN:  PT FREQUENCY: 1x/week  PT DURATION: 6 weeks  PLANNED INTERVENTIONS: [id number]- PT Re-evaluation, 97110-Therapeutic exercises, 97530- Therapeutic activity, [id number]- Neuromuscular re-education, 97535- Self Care, [id number]- Manual therapy, [id number]- Gait training, [phone]- Aquatic Therapy, and Dry Needling.  PLAN FOR NEXT SESSION: NA - patient D/C without AD  Carmelia Bake,  PT, DTP  05/06/2023, 1:11 PM

## 2023-06-08 ENCOUNTER — Other Ambulatory Visit: Payer: Self-pay | Admitting: Family Medicine

## 2023-06-08 DIAGNOSIS — E1169 Type 2 diabetes mellitus with other specified complication: Secondary | ICD-10-CM

## 2023-06-08 DIAGNOSIS — Z9189 Other specified personal risk factors, not elsewhere classified: Secondary | ICD-10-CM

## 2023-06-18 ENCOUNTER — Ambulatory Visit
Admission: RE | Admit: 2023-06-18 | Discharge: 2023-06-18 | Disposition: A | Discharge status: Home / Self Care | Payer: Self-pay | Source: Ambulatory Visit | Attending: Family Medicine | Admitting: Family Medicine

## 2023-06-18 DIAGNOSIS — E785 Hyperlipidemia, unspecified: Secondary | ICD-10-CM | POA: Insufficient documentation

## 2023-06-18 DIAGNOSIS — E1169 Type 2 diabetes mellitus with other specified complication: Secondary | ICD-10-CM | POA: Insufficient documentation

## 2023-06-18 DIAGNOSIS — Z9189 Other specified personal risk factors, not elsewhere classified: Secondary | ICD-10-CM | POA: Insufficient documentation
